# Patient Record
Sex: Male | Born: 1985 | Race: White | Hispanic: No | Marital: Married | Smoking: Never smoker
Health system: Southern US, Community
[De-identification: ages and names within clinical notes are randomized; demographics above are authoritative.]

## PROBLEM LIST (undated history)

## (undated) DIAGNOSIS — E785 Hyperlipidemia, unspecified: Secondary | ICD-10-CM

## (undated) DIAGNOSIS — I10 Essential (primary) hypertension: Secondary | ICD-10-CM

## (undated) DIAGNOSIS — I493 Ventricular premature depolarization: Secondary | ICD-10-CM

## (undated) DIAGNOSIS — F411 Generalized anxiety disorder: Secondary | ICD-10-CM

## (undated) DIAGNOSIS — R030 Elevated blood-pressure reading, without diagnosis of hypertension: Secondary | ICD-10-CM

## (undated) DIAGNOSIS — M109 Gout, unspecified: Secondary | ICD-10-CM

## (undated) HISTORY — DX: Gout, unspecified: M10.9

## (undated) HISTORY — DX: Generalized anxiety disorder: F41.1

## (undated) HISTORY — DX: Essential (primary) hypertension: I10

## (undated) HISTORY — DX: Hyperlipidemia, unspecified: E78.5

## (undated) HISTORY — DX: Ventricular premature depolarization: I49.3

## (undated) HISTORY — DX: Elevated blood-pressure reading, without diagnosis of hypertension: R03.0

---
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
## 2015-12-21 ENCOUNTER — Ambulatory Visit (INDEPENDENT_AMBULATORY_CARE_PROVIDER_SITE_OTHER): Payer: 59 | Admitting: Family Medicine

## 2015-12-21 ENCOUNTER — Encounter: Payer: Self-pay | Admitting: Family Medicine

## 2015-12-21 VITALS — BP 142/84 | HR 99 | Temp 98.7°F | Ht 70.0 in | Wt 209.1 lb

## 2015-12-21 DIAGNOSIS — H6991 Unspecified Eustachian tube disorder, right ear: Secondary | ICD-10-CM | POA: Diagnosis not present

## 2015-12-21 DIAGNOSIS — J069 Acute upper respiratory infection, unspecified: Secondary | ICD-10-CM | POA: Diagnosis not present

## 2015-12-21 DIAGNOSIS — R0981 Nasal congestion: Secondary | ICD-10-CM

## 2015-12-21 DIAGNOSIS — H9201 Otalgia, right ear: Secondary | ICD-10-CM

## 2015-12-21 MED ORDER — AMOXICILLIN 875 MG PO TABS: 875.0000 mg | ORAL_TABLET | Freq: Two times a day (BID) | ORAL | 0 refills | Status: DC

## 2015-12-21 NOTE — Progress Notes (Signed)
Subjective:    Patient ID: Nathaniel Frank, male    DOB: 15-Aug-1985, 30 y.o.   MRN: [id number]  HPI This is a 30 yo who presents today with right ear fullness, nasal congestion for 2 weeks. Felt really bad for about 2 days (was in bed), but feels a lot better. Most annoying symptom is ear fullness. Started with feeling like he was getting a cold, had scratchy throat that progressed to nasal drainage (green and bloody), cough that has since resolved. Not currently taking any medicine. Took a couple of doses of dayquil/nyquil at beginning of illness. Had sharp pain in ear but now it is full feeling and sound is muffled. Never smoked, no history of asthma or seasonal allergies. No fever.   He has recently moved back to the area from [place], [place]. He is a Merchandiser, retail at [employer]. His wife will be induced in 4 days (son). He also lives with his step-children (ages 40,9). He has an appointment to establish care with Mayra Reel, NP 01/04/16.   He had PVCs several years ago and had cardiology work up. Was told they would resolve and they did. Was on Bystolic for intermittent HTN which improved and he was able to come off. No current palpitations. No problems taking OTC cold/allergy preparations.   No past medical history on file. No past surgical history on file. No family history on file. Social History  Substance Use Topics  . Smoking status: Never Smoker  . Smokeless tobacco: Never Used  . Alcohol use Yes     Comment: soical        Review of Systems Per HPI    Objective:   Physical Exam  Constitutional: He is oriented to person, place, and time. He appears well-developed and well-nourished. No distress.  HENT:  Head: Atraumatic.  Right Ear: External ear and ear canal normal. Tympanic membrane is retracted.  Left Ear: Tympanic membrane, external ear and ear canal normal.  Nose: Rhinorrhea present. No mucosal edema. Right sinus exhibits no maxillary sinus tenderness and no frontal sinus  tenderness. Left sinus exhibits no maxillary sinus tenderness and no frontal sinus tenderness.  Eyes: Conjunctivae are normal.  Neck: Normal range of motion. Neck supple.  Cardiovascular: Normal rate, regular rhythm and normal heart sounds.   Pulmonary/Chest: Effort normal and breath sounds normal.  Neurological: He is alert and oriented to person, place, and time.  Skin: Skin is warm and dry. He is not diaphoretic.  Psychiatric: He has a normal mood and affect. His behavior is normal. Judgment and thought content normal.  Vitals reviewed.     BP (!) 142/84   Pulse 99   Temp 98.7 F (37.1 C) (Oral)   Ht 5\' 10"  (1.778 m)   Wt 209 lb 1.9 oz (94.9 kg)   SpO2 98%   BMI 30.01 kg/m      Assessment & Plan:  1. Otalgia, right - OTC analgesics PRN  2. Eustachian tube disorder, right - Provided written and verbal information regarding diagnosis and treatment. - OTC analgesics, decongestants, Afrin nasal spray for 3 days max  3. Sinus congestion - see #2  4. Acute upper respiratory infection - provided wait and see antibiotic and instructed patient to take if not better in 48 hours with symptomatic relief - amoxicillin (AMOXIL) 875 MG tablet; Take 1 tablet (875 mg total) by mouth 2 (two) times daily.  Dispense: 14 tablet; Refill: 0 - RTC precautions reviewed  Olean Ree, FNP-BC  [facility]  [place], [facility]  12/21/2015 1:39 PM

## 2015-12-21 NOTE — Progress Notes (Signed)
Pre visit review using our clinic review tool, if applicable. No additional management support is needed unless otherwise documented below in the visit note. 

## 2015-12-21 NOTE — Patient Instructions (Signed)
  For muscle aches, headache, sore throat you can take over the counter acetaminophen or ibuprofen as directed on the package For nasal congestion you can use Afrin nasal spray twice a day for up to 3 days, and /or sudafed, and/or saline nasal spray For cough you can use Delsym cough syrup Please come back to see us or go to the emergency department if you are not better in 5 to 7 days or if you develop fever over 101 for more than 48 hours or if you develop wheezing or shortness of breath.  If you are not better in 48 hours, please have antibiotic prescription filled and start taking.

## 2016-01-04 ENCOUNTER — Encounter: Payer: Self-pay | Admitting: Primary Care

## 2016-01-04 ENCOUNTER — Ambulatory Visit (INDEPENDENT_AMBULATORY_CARE_PROVIDER_SITE_OTHER): Payer: 59 | Admitting: Primary Care

## 2016-01-04 DIAGNOSIS — J302 Other seasonal allergic rhinitis: Secondary | ICD-10-CM

## 2016-01-04 DIAGNOSIS — R030 Elevated blood-pressure reading, without diagnosis of hypertension: Secondary | ICD-10-CM

## 2016-01-04 DIAGNOSIS — I10 Essential (primary) hypertension: Secondary | ICD-10-CM | POA: Insufficient documentation

## 2016-01-04 DIAGNOSIS — IMO0001 Reserved for inherently not codable concepts without codable children: Secondary | ICD-10-CM

## 2016-01-04 DIAGNOSIS — J309 Allergic rhinitis, unspecified: Secondary | ICD-10-CM | POA: Insufficient documentation

## 2016-01-04 NOTE — Assessment & Plan Note (Signed)
Long history of elevated blood pressure readings in the 140s/80's. Prior treatment of PVCs with Bystolic but has not taken since 2012. Will have him work on DelphiDASH diet as this has proved to be effective in the past. Discussion today regarding long-term effects of hypertension. We'll continue to monitor and if no improvement then we will need to initiate oral treatment.

## 2016-01-04 NOTE — Patient Instructions (Signed)
Try Claritin-D/Allegra-D/ZyrtecD for ear congestion. This may be purchased behind the pharmacy counter. Take this as directed for 5 days, then switch to plain Claritin/Allegra/Zyrtec.   Try using Flonase (fluticasone) nasal spray everyday for the next 2-3 weeks. Instill 1 spray in each nostril twice daily.   Your blood pressure is slightly too high. Work to reduce consumption of salt in your diet. Take a look at the information below.  Please schedule a physical with me within the next 6 months. You may also schedule a lab only appointment 3-4 days prior. We will discuss your lab results in detail during your physical.  It was a pleasure to meet you today! Please don't hesitate to call me with any questions. Welcome to [facility]!  DASH Eating Plan DASH stands for "Dietary Approaches to Stop Hypertension." The DASH eating plan is a healthy eating plan that has been shown to reduce high blood pressure (hypertension). Additional health benefits may include reducing the risk of type 2 diabetes mellitus, heart disease, and stroke. The DASH eating plan may also help with weight loss. WHAT DO I NEED TO KNOW ABOUT THE DASH EATING PLAN? For the DASH eating plan, you will follow these general guidelines:  Choose foods with a percent daily value for sodium of less than 5% (as listed on the food label).  Use salt-free seasonings or herbs instead of table salt or sea salt.  Check with your health care provider or pharmacist before using salt substitutes.  Eat lower-sodium products, often labeled as "lower sodium" or "no salt added."  Eat fresh foods.  Eat more vegetables, fruits, and low-fat dairy products.  Choose whole grains. Look for the word "whole" as the first word in the ingredient list.  Choose fish and skinless chicken or Malaw[place]urkey more often than red meat. Limit fish, poultry, and meat to 6 oz (170 g) each day.  Limit sweets, desserts, sugars, and sugary drinks.  Choose heart-healthy  fats.  Limit cheese to 1 oz (28 g) per day.  Eat more home-cooked food and less restaurant, buffet, and fast food.  Limit fried foods.  Cook foods using methods other than frying.  Limit canned vegetables. If you do use them, rinse them well to decrease the sodium.  When eating at a restaurant, ask that your food be prepared with less salt, or no salt if possible. WHAT FOODS CAN I EAT? Seek help from a dietitian for individual calorie needs. Grains Whole grain or whole wheat bread. Brown rice. Whole grain or whole wheat pasta. Quinoa, bulgur, and whole grain cereals. Low-sodium cereals. Corn or whole wheat flour tortillas. Whole grain cornbread. Whole grain crackers. Low-sodium crackers. Vegetables Fresh or frozen vegetables (raw, steamed, roasted, or grilled). Low-sodium or reduced-sodium tomato and vegetable juices. Low-sodium or reduced-sodium tomato sauce and paste. Low-sodium or reduced-sodium canned vegetables.  Fruits All fresh, canned (in natural juice), or frozen fruits. Meat and Other Protein Products Ground beef (85% or leaner), grass-fed beef, or beef trimmed of fat. Skinless chicken or Malaw[place]urkey. Ground chicken or Mal[place]urkey. Pork trimmed of fat. All fish and seafood. Eggs. Dried beans, peas, or lentils. Unsalted nuts and seeds. Unsalted canned beans. Dairy Low-fat dairy products, such as skim or 1% milk, 2% or reduced-fat cheeses, low-fat ricotta or cottage cheese, or plain low-fat yogurt. Low-sodium or reduced-sodium cheeses. Fats and Oils Tub margarines without trans fats. Light or reduced-fat mayonnaise and salad dressings (reduced sodium). Avocado. Safflower, olive, or canola oils. Natural peanut or almond butter. Other Unsalted popcorn and  pretzels. The items listed above may not be a complete list of recommended foods or beverages. Contact your dietitian for more options. WHAT FOODS ARE NOT RECOMMENDED? Grains White bread. White pasta. White rice. Refined cornbread.  Bagels and croissants. Crackers that contain trans fat. Vegetables Creamed or fried vegetables. Vegetables in a cheese sauce. Regular canned vegetables. Regular canned tomato sauce and paste. Regular tomato and vegetable juices. Fruits Dried fruits. Canned fruit in light or heavy syrup. Fruit juice. Meat and Other Protein Products Fatty cuts of meat. Ribs, chicken wings, bacon, sausage, bologna, salami, chitterlings, fatback, hot dogs, bratwurst, and packaged luncheon meats. Salted nuts and seeds. Canned beans with salt. Dairy Whole or 2% milk, cream, half-and-half, and cream cheese. Whole-fat or sweetened yogurt. Full-fat cheeses or blue cheese. Nondairy creamers and whipped toppings. Processed cheese, cheese spreads, or cheese curds. Condiments Onion and garlic salt, seasoned salt, table salt, and sea salt. Canned and packaged gravies. Worcestershire sauce. Tartar sauce. Barbecue sauce. Teriyaki sauce. Soy sauce, including reduced sodium. Steak sauce. Fish sauce. Oyster sauce. Cocktail sauce. Horseradish. Ketchup and mustard. Meat flavorings and tenderizers. Bouillon cubes. Hot sauce. Tabasco sauce. Marinades. Taco seasonings. Relishes. Fats and Oils Butter, stick margarine, lard, shortening, ghee, and bacon fat. Coconut, palm kernel, or palm oils. Regular salad dressings. Other Pickles and olives. Salted popcorn and pretzels. The items listed above may not be a complete list of foods and beverages to avoid. Contact your dietitian for more information. WHERE CAN I FIND MORE INFORMATION? National Heart, Lung, and Blood Institute: [employer]   This information is not intended to replace advice given to you by your health care provider. Make sure you discuss any questions you have with your health care provider.   Document Released: 03/27/2011 Document Revised: 04/28/2014 Document Reviewed: 02/09/2013 Elsevier Interactive Patient Education [employer].

## 2016-01-04 NOTE — Progress Notes (Signed)
Pre visit review using our clinic review tool, if applicable. No additional management support is needed unless otherwise documented below in the visit note. 

## 2016-01-04 NOTE — Assessment & Plan Note (Signed)
Treated on 09/01 for URI. Completed amoxicillin Monday this week. Overall feeling better, however, does still experience right ear pressure/pain. Exam today with bulging TM on right side without erythema or evidence of infection. Will have him try Claritin-D for 5 days then switch to plain Claritin. Start Flonase twice daily. He will notify us if no improvement.

## 2016-01-04 NOTE — Progress Notes (Signed)
Subjective:    Patient ID: Nathaniel Frank, male    DOB: 13-Mar-1986, 30 y.o.   MRN: [id number]  HPI  Mr. Persky is a 30 year old male who presents today to establish care and discuss the problems mentioned below. Will obtain old records. His last physical was several years ago.   1) URI/Allergic Rhinitis: Evaluated on 12/21/15 for complaints of nasal congestion and ear fullness for 2 weeks prior. He was diagnosed with eustachian tube disorder and URI, and was provided with a script for Amoxicillin 875 mg tablets to use if no improvement.   Since his last visit he's feeling better, but has continued to noticed right ear pressure. He completed his Amoxicillin Monday this week. He's used Flonase for 3 days and then started the antibiotic. He's also experiencing sore throat and post nasal drip. Denies cough, fevers, body aches.   2) Elevated Blood Pressure Reading: History of elevated readings in the past. His BP runs 140-145/60's-80's on average. He was once managed on Bystolic in the past for PVC's, but has not taken this medication since 2012. No family history of high blood pressure. His last BP check was on 09/01 at 142/84, and 1 week prior to that at his work health screening which was 140's/80's. Denies chest pain, dizziness, shortness of breath, headaches.  He has lowered his BP before with improvements in diet including salt reduction. He just moved to the area and has been eating junk food and fast food recently.  Review of Systems  Constitutional: Negative for chills and fever.  HENT: Positive for congestion, ear pain and postnasal drip. Negative for sinus pressure and sore throat.   Eyes: Negative for visual disturbance.  Respiratory: Negative for cough and shortness of breath.   Cardiovascular: Negative for chest pain.  Neurological: Negative for dizziness and headaches.       Past Medical History:  Diagnosis Date  . Hypertension   . PVC (premature ventricular contraction)        Social History   Social History  . Marital status: Legally Separated    Spouse name: N/A  . Number of children: N/A  . Years of education: N/A   Occupational History  . Not on file.   Social History Main Topics  . Smoking status: Never Smoker  . Smokeless tobacco: Never Used  . Alcohol use Yes     Comment: soical  . Drug use: Unknown  . Sexual activity: Not on file   Other Topics Concern  . Not on file   Social History Narrative   Engaged.   3 children.   Works for [employer].   Enjoys golfing.     Past Surgical History:  Procedure Laterality Date  . TONSILLECTOMY AND ADENOIDECTOMY  1999    Family History  Problem Relation Age of Onset  . Renal cancer Father     No Known Allergies  No current outpatient prescriptions on file prior to visit.   No current facility-administered medications on file prior to visit.     BP (!) 142/80   Pulse 85   Temp 98.5 F (36.9 C) (Oral)   Ht 5\' 10"  (1.778 m)   Wt 208 lb 1.9 oz (94.4 kg)   SpO2 98%   BMI 29.86 kg/m    Objective:   Physical Exam  Constitutional: He appears well-nourished.  HENT:  Right Ear: Ear canal normal. Tympanic membrane is bulging. Tympanic membrane is not erythematous.  Left Ear: Tympanic membrane and ear canal normal.  Tympanic membrane is not erythematous and not bulging.  Nose: No mucosal edema. Right sinus exhibits no maxillary sinus tenderness and no frontal sinus tenderness. Left sinus exhibits no maxillary sinus tenderness and no frontal sinus tenderness.  Mouth/Throat: Oropharynx is clear and moist.  Eyes: Conjunctivae are normal.  Neck: Neck supple.  Cardiovascular: Normal rate and regular rhythm.   Pulmonary/Chest: Effort normal and breath sounds normal. He has no wheezes. He has no rales.  Skin: Skin is warm and dry.          Assessment & Plan:

## 2016-07-30 ENCOUNTER — Ambulatory Visit (INDEPENDENT_AMBULATORY_CARE_PROVIDER_SITE_OTHER)
Admission: RE | Admit: 2016-07-30 | Discharge: 2016-07-30 | Disposition: A | Discharge status: Home / Self Care | Payer: 59 | Source: Ambulatory Visit | Attending: Family Medicine | Admitting: Family Medicine

## 2016-07-30 ENCOUNTER — Encounter: Payer: Self-pay | Admitting: Family Medicine

## 2016-07-30 ENCOUNTER — Ambulatory Visit (INDEPENDENT_AMBULATORY_CARE_PROVIDER_SITE_OTHER): Payer: 59 | Admitting: Family Medicine

## 2016-07-30 VITALS — BP 130/88 | HR 92 | Temp 98.6°F | Ht 70.0 in | Wt 222.8 lb

## 2016-07-30 DIAGNOSIS — M25572 Pain in left ankle and joints of left foot: Secondary | ICD-10-CM

## 2016-07-30 DIAGNOSIS — M79672 Pain in left foot: Secondary | ICD-10-CM

## 2016-07-30 MED ORDER — COLCHICINE 0.6 MG PO TABS: 0.6000 mg | ORAL_TABLET | Freq: Two times a day (BID) | ORAL | 2 refills | Status: DC

## 2016-07-30 MED ORDER — INDOMETHACIN 50 MG PO CAPS: 50.0000 mg | ORAL_CAPSULE | Freq: Three times a day (TID) | ORAL | 2 refills | Status: DC

## 2016-07-30 MED ORDER — COLCHICINE 0.6 MG PO CAPS: 1.0000 | ORAL_CAPSULE | Freq: Two times a day (BID) | ORAL | 2 refills | Status: DC

## 2016-07-30 NOTE — Progress Notes (Signed)
Pre visit review using our clinic review tool, if applicable. No additional management support is needed unless otherwise documented below in the visit note. 

## 2016-07-30 NOTE — Progress Notes (Signed)
Dr. Karleen Hampshire T. Damonta Cossey, MD, CAQ [facility] [address] [place], [address] Phone: [phone] Fax: [phone]  07/30/2016  Patient: Nathaniel Frank, MRN: [id number], DOB: 08/25/1985, 31 y.o.  Primary Physician:  Morrie Sheldon, NP   Chief Complaint  Patient presents with  . Ankle Pain    Left-started hurting on Tuesday but has progressively gotten worse   Subjective:   Nathaniel Frank is a 31 y.o. very pleasant male patient who presents with the following:  Went golfing on Sunday - felt like it needed to pop on Monday and has gotten progressively worse. First couple of steps have been more painful.  This is progressed since then, and he has not had any injury at all.  Last night it hurt so bad that he had a hard time even keeping it on his mattress.  This morning he had difficulty walking.  There is been no known warmth or redness.  Previously he did have a history of a painful MTP joint in the opposite foot.  Really bad excruciating pain.   Past Medical History, Surgical History, Social History, Family History, Problem List, Medications, and Allergies have been reviewed and updated if relevant.  Patient Active Problem List   Diagnosis Date Noted  . Allergic rhinitis 01/04/2016  . Elevated blood pressure 01/04/2016    Past Medical History:  Diagnosis Date  . Hypertension   . PVC (premature ventricular contraction)     Past Surgical History:  Procedure Laterality Date  . TONSILLECTOMY AND ADENOIDECTOMY  1999    Social History   Social History  . Marital status: Legally Separated    Spouse name: N/A  . Number of children: N/A  . Years of education: N/A   Occupational History  . Not on file.   Social History Main Topics  . Smoking status: Never Smoker  . Smokeless tobacco: Never Used  . Alcohol use Yes     Comment: soical  . Drug use: Unknown  . Sexual activity: Not on file   Other Topics Concern  . Not  on file   Social History Narrative   Engaged.   3 children.   Works for [employer].   Enjoys golfing.     Family History  Problem Relation Age of Onset  . Renal cancer Father     No Known Allergies  Medication list reviewed and updated in full in [facility].  GEN: No fevers, chills. Nontoxic. Primarily MSK c/o today. MSK: Detailed in the HPI GI: tolerating PO intake without difficulty Neuro: No numbness, parasthesias, or tingling associated. Otherwise the pertinent positives of the ROS are noted above.   Objective:   BP 130/88   Pulse 92   Temp 98.6 F (37 C) (Oral)   Ht  (1.778 m)   Wt 222 lb 12 oz (101 kg)   BMI 31.96 kg/m    GEN: WDWN, NAD, Non-toxic, Alert & Oriented x 3 HEENT: Atraumatic, Normocephalic.  Ears and Nose: No external deformity. EXTR: No clubbing/cyanosis/edema NEURO: difficulty bearing weight on L foot PSYCH: Normally interactive. Conversant. Not depressed or anxious appearing.  Calm demeanor.    Left foot and ankle.  Mild tenderness with palpation and movement of the left MTP, great.  Forefoot otherwise is nontender.  Midfoot is mildly tender to compression.  The talus in the true ankle joint are mildly tender to palpation.  The lateral ankle and medial ankle in the region of the peroneal and  posterior tibialis tendons are tender to palpation.  There is no significant swelling or redness.  Radiology: Dg Ankle Complete Left  Result Date: 07/30/2016 CLINICAL DATA:  Left ankle pain, no known injury, initial encounter EXAM: LEFT ANKLE COMPLETE - 3+ VIEW COMPARISON:  None. FINDINGS: There is no evidence of fracture, dislocation, or joint effusion. There is no evidence of arthropathy or other focal bone abnormality. Soft tissues are unremarkable. IMPRESSION: No acute abnormality noted. Electronically Signed   By: Alcide Clever M.D.   On: 07/30/2016 12:34   Dg Foot Complete Left  Result Date: 07/30/2016 CLINICAL DATA:  Left foot pain, no known  injury, initial encounter EXAM: LEFT FOOT - COMPLETE 3+ VIEW COMPARISON:  None. FINDINGS: There is no evidence of fracture or dislocation. There is no evidence of arthropathy or other focal bone abnormality. Soft tissues are unremarkable. IMPRESSION: No acute abnormality noted. Electronically Signed   By: Alcide Clever M.D.   On: 07/30/2016 12:35     Assessment and Plan:   Acute foot pain, left - Plan: DG Foot Complete Left  Acute left ankle pain - Plan: DG Ankle Complete Left  Probable gout versus CPPD.  No injury mechanism.  Ankle appears stable.  All ligaments are normal.  He does have some tenderness additionally in the peroneal and posterior tibialis tendons.  Follow-up: No Follow-up on file.  Meds ordered this encounter  Medications  . triamcinolone (NASACORT ALLERGY 24HR) 55 MCG/ACT AERO nasal inhaler    Sig: Place 2 sprays into the nose daily.  . indomethacin (INDOCIN) 50 MG capsule    Sig: Take 1 capsule (50 mg total) by mouth 3 (three) times daily with meals.    Dispense:  60 capsule    Refill:  2  . DISCONTD: Colchicine (MITIGARE) 0.6 MG CAPS    Sig: Take 1 capsule by mouth 2 (two) times daily.    Dispense:  60 capsule    Refill:  2  . colchicine 0.6 MG tablet    Sig: Take 1 tablet (0.6 mg total) by mouth 2 (two) times daily.    Dispense:  60 tablet    Refill:  2   Medications Discontinued During This Encounter  Medication Reason  . Colchicine (MITIGARE) 0.6 MG CAPS Not covered by the pt's insurance   Orders Placed This Encounter  Procedures  . DG Ankle Complete Left  . DG Foot Complete Left    Signed,  Renn Dirocco T. Staria Birkhead, MD   Allergies as of 07/30/2016   No Known Allergies     Medication List       Accurate as of 07/30/16  6:25 PM. Always use your most recent med list.          colchicine 0.6 MG tablet Take 1 tablet (0.6 mg total) by mouth 2 (two) times daily.   indomethacin 50 MG capsule Commonly known as:  INDOCIN Take 1 capsule (50 mg total)  by mouth 3 (three) times daily with meals.   NASACORT ALLERGY 24HR 55 MCG/ACT Aero nasal inhaler Generic drug:  triamcinolone Place 2 sprays into the nose daily.

## 2016-09-03 ENCOUNTER — Other Ambulatory Visit: Payer: Self-pay | Admitting: Primary Care

## 2016-09-03 DIAGNOSIS — Z0000 Encounter for general adult medical examination without abnormal findings: Secondary | ICD-10-CM

## 2016-09-10 ENCOUNTER — Other Ambulatory Visit: Payer: 59

## 2016-09-17 ENCOUNTER — Encounter: Payer: 59 | Admitting: Primary Care

## 2016-10-08 ENCOUNTER — Other Ambulatory Visit (INDEPENDENT_AMBULATORY_CARE_PROVIDER_SITE_OTHER): Payer: 59

## 2016-10-08 DIAGNOSIS — Z0000 Encounter for general adult medical examination without abnormal findings: Secondary | ICD-10-CM | POA: Diagnosis not present

## 2016-10-09 LAB — COMPREHENSIVE METABOLIC PANEL
A/G RATIO: 2 (ref 1.2–2.2)
ALT: 26 IU/L (ref 0–44)
AST: 20 IU/L (ref 0–40)
Albumin: 4.9 g/dL (ref 3.5–5.5)
Alkaline Phosphatase: 43 IU/L (ref 39–117)
BILIRUBIN TOTAL: 0.8 mg/dL (ref 0.0–1.2)
BUN/Creatinine Ratio: 12 (ref 9–20)
BUN: 12 mg/dL (ref 6–20)
CALCIUM: 9.6 mg/dL (ref 8.7–10.2)
CHLORIDE: 103 mmol/L (ref 96–106)
CO2: 21 mmol/L (ref 20–29)
Creatinine, Ser: 1.02 mg/dL (ref 0.76–1.27)
GFR, EST AFRICAN AMERICAN: 111 mL/min/{1.73_m2} (ref >59)
GFR, EST NON AFRICAN AMERICAN: 96 mL/min/{1.73_m2} (ref >59)
GLOBULIN, TOTAL: 2.5 g/dL (ref 1.5–4.5)
Glucose: 108 mg/dL — ABNORMAL HIGH (ref 65–99)
Potassium: 4.2 mmol/L (ref 3.5–5.2)
Sodium: 140 mmol/L (ref 134–144)
TOTAL PROTEIN: 7.4 g/dL (ref 6.0–8.5)

## 2016-10-09 LAB — LIPID PANEL
Chol/HDL Ratio: 5.7 ratio — ABNORMAL HIGH (ref 0.0–5.0)
Cholesterol, Total: 235 mg/dL — ABNORMAL HIGH (ref 100–199)
HDL: 41 mg/dL (ref >39)
LDL Calculated: 145 mg/dL — ABNORMAL HIGH (ref 0–99)
Triglycerides: 244 mg/dL — ABNORMAL HIGH (ref 0–149)
VLDL Cholesterol Cal: 49 mg/dL — ABNORMAL HIGH (ref 5–40)

## 2016-10-14 ENCOUNTER — Ambulatory Visit (INDEPENDENT_AMBULATORY_CARE_PROVIDER_SITE_OTHER): Payer: 59 | Admitting: Primary Care

## 2016-10-14 ENCOUNTER — Encounter: Payer: Self-pay | Admitting: Primary Care

## 2016-10-14 VITALS — BP 134/84 | HR 75 | Temp 98.0°F | Ht 70.0 in | Wt 224.4 lb

## 2016-10-14 DIAGNOSIS — F411 Generalized anxiety disorder: Secondary | ICD-10-CM | POA: Diagnosis not present

## 2016-10-14 DIAGNOSIS — Z0000 Encounter for general adult medical examination without abnormal findings: Secondary | ICD-10-CM | POA: Insufficient documentation

## 2016-10-14 DIAGNOSIS — Z0001 Encounter for general adult medical examination with abnormal findings: Secondary | ICD-10-CM | POA: Diagnosis not present

## 2016-10-14 DIAGNOSIS — R030 Elevated blood-pressure reading, without diagnosis of hypertension: Secondary | ICD-10-CM

## 2016-10-14 MED ORDER — ESCITALOPRAM OXALATE 10 MG PO TABS: 10.0000 mg | ORAL_TABLET | Freq: Every day | ORAL | 1 refills | Status: DC

## 2016-10-14 NOTE — Patient Instructions (Signed)
Start Lexapro tablets for anxiety. Start by taking 1/2 tablet daily for 8 days, then increase to 1 full tablet thereafter.  Start exercising. You should be getting 150 minutes of moderate intensity exercise weekly.  It's important to improve your diet by reducing consumption of fried food, pizza, processed snack foods, sugary drinks. Increase consumption of fresh vegetables and fruits, whole grains, water.  Ensure you are drinking 64 ounces of water daily.  Return to a [employer] [employer] station fasting for repeat cholesterol and blood sugar testing. Make sure you are fasting 8 hours prior to this test.  Follow up in 6 weeks for re-evaluation of anxiety. Call me sooner if you have any problems.  It was a pleasure to see you today!

## 2016-10-14 NOTE — Assessment & Plan Note (Signed)
Overall improved, encouraged weight loss through healthy diet and exercise. Avoid fast/fried/processed foods. Continue to monitor.

## 2016-10-14 NOTE — Progress Notes (Signed)
Subjective:    Patient ID: Nathaniel Frank, male    DOB: 07/24/1985, 31 y.o.   MRN: [id number]  HPI  Nathaniel Frank is a 31 year old male who presents today for complete physical and a chief complaint of anxiety.  He has been struggling with anxiety intermittently for years. He's experiencing both home and occupational stress as he's moved to a new state, is in a new marriage with two new step-children, a newborn at home, and increased demands at work. He's feeling overwhelmed with work and home life. His symptoms have been nearly everyday. Previously managed on lorazepam that he took at bedtime for anxiety/insomnia. He was also prescribed Zoloft, took it only for a few days as it caused a sluggish feeling. He would like to have medication like lorazepam to take as needed for breakthrough anxiety. He doesn't wish to take medication everyday despite his symptoms occuring nearly daily. GAD 7 score of 16 today.  Immunizations: -Tetanus: Completed over 10 years ago.  -Influenza: Did not complete last season   Diet: He endorses a fair diet. Breakfast: Coffee Lunch: Chips, vending machine food, taco truck Dinner: Vegetables, frozen pizza, hot dogs, steak Snacks: None Desserts: None Beverages: Coffee, water, 1-2 glasses of wine/beer  Exercise: He does not currently exercise Eye exam: Completed years ago, wears readers. Dental exam: Completes annually typically.   Review of Systems  Constitutional: Negative for unexpected weight change.  HENT: Negative for rhinorrhea.   Respiratory: Negative for cough and shortness of breath.   Cardiovascular: Negative for chest pain.  Gastrointestinal: Negative for constipation and diarrhea.  Genitourinary: Negative for difficulty urinating.  Musculoskeletal: Negative for arthralgias and myalgias.  Skin: Negative for rash.  Allergic/Immunologic: Negative for environmental allergies.  Neurological: Negative for dizziness, numbness and headaches.    Psychiatric/Behavioral:       See HPI       Past Medical History:  Diagnosis Date  . Hypertension   . PVC (premature ventricular contraction)      Social History   Social History  . Marital status: Legally Separated    Spouse name: N/A  . Number of children: N/A  . Years of education: N/A   Occupational History  . Not on file.   Social History Main Topics  . Smoking status: Never Smoker  . Smokeless tobacco: Never Used  . Alcohol use Yes     Comment: soical  . Drug use: Unknown  . Sexual activity: Not on file   Other Topics Concern  . Not on file   Social History Narrative   Engaged.   3 children.   Works for [employer]   Enjoys golfing.     Past Surgical History:  Procedure Laterality Date  . TONSILLECTOMY AND ADENOIDECTOMY  1999    Family History  Problem Relation Age of Onset  . Renal cancer Father     No Known Allergies  Current Outpatient Prescriptions on File Prior to Visit  Medication Sig Dispense Refill  . triamcinolone (NASACORT ALLERGY 24HR) 55 MCG/ACT AERO nasal inhaler Place 2 sprays into the nose daily.    . colchicine 0.6 MG tablet Take 1 tablet (0.6 mg total) by mouth 2 (two) times daily. (Patient not taking: Reported on 10/14/2016) 60 tablet 2  . indomethacin (INDOCIN) 50 MG capsule Take 1 capsule (50 mg total) by mouth 3 (three) times daily with meals. (Patient not taking: Reported on 10/14/2016) 60 capsule 2   No current facility-administered medications on file prior to visit.  BP 134/84   Pulse 75   Temp 98 F (36.7 C) (Oral)   Ht 5\' 10"  (1.778 m)   Wt 224 lb 6.4 oz (101.8 kg)   SpO2 98%   BMI 32.20 kg/m    Objective:   Physical Exam  Constitutional: He is oriented to person, place, and time. He appears well-nourished.  HENT:  Right Ear: Tympanic membrane and ear canal normal.  Left Ear: Tympanic membrane and ear canal normal.  Nose: Nose normal. Right sinus exhibits no maxillary sinus tenderness and no frontal sinus  tenderness. Left sinus exhibits no maxillary sinus tenderness and no frontal sinus tenderness.  Mouth/Throat: Oropharynx is clear and moist.  Eyes: Conjunctivae and EOM are normal. Pupils are equal, round, and reactive to light.  Neck: Neck supple. Carotid bruit is not present. No thyromegaly present.  Cardiovascular: Normal rate, regular rhythm and normal heart sounds.   Pulmonary/Chest: Effort normal and breath sounds normal. He has no wheezes. He has no rales.  Abdominal: Soft. Bowel sounds are normal. There is no tenderness.  Musculoskeletal: Normal range of motion.  Neurological: He is alert and oriented to person, place, and time. He has normal reflexes. No cranial nerve deficit.  Skin: Skin is warm and dry.  Psychiatric: He has a normal mood and affect.          Assessment & Plan:

## 2016-10-14 NOTE — Assessment & Plan Note (Signed)
Ongoing for years, overall able to manage except for the last several months. GAD 7 score of 16 today.  Discussed that benzodiazepines are not appropriate in treating anxiety with daily symptoms. Recommended he try another SSRI such as Lexapro, he agrees. Patient is to take 1/2 tablet daily for 8 days, then advance to 1 full tablet thereafter. We discussed possible side effects of headache, GI upset, drowsiness, and SI/HI. If thoughts of SI/HI develop, we discussed to present to the emergency immediately. Patient verbalized understanding.   Will see him back in 6 weeks. If he can't tolerate Lexapro then consider Buspar. Will not be prescribing him lorazepam or other benzos.

## 2016-10-14 NOTE — Assessment & Plan Note (Signed)
Td due, will administer next visit. Discussed the importance of a healthy diet and regular exercise in order for weight loss, and to reduce the risk of other medical problems. Exam today overall unremarkable. Will treat anxiety. Labs are above goal, but he was not fasting. Will recheck lipids and glucose when fasting. Follow up in 1 year for annual exam.

## 2016-11-25 ENCOUNTER — Encounter: Payer: Self-pay | Admitting: Primary Care

## 2016-11-25 ENCOUNTER — Ambulatory Visit (INDEPENDENT_AMBULATORY_CARE_PROVIDER_SITE_OTHER): Payer: 59 | Admitting: Primary Care

## 2016-11-25 VITALS — BP 124/82 | HR 64 | Temp 98.8°F | Ht 70.0 in | Wt 224.4 lb

## 2016-11-25 DIAGNOSIS — F411 Generalized anxiety disorder: Secondary | ICD-10-CM | POA: Diagnosis not present

## 2016-11-25 MED ORDER — VENLAFAXINE HCL ER 37.5 MG PO CP24: 37.5000 mg | ORAL_CAPSULE | Freq: Every day | ORAL | 1 refills | Status: DC

## 2016-11-25 NOTE — Assessment & Plan Note (Signed)
No improvement on Lexapro, also with side effect of decreased libido. Will wean off Lexapro and switch to Effexor 37.5 mg. He will message/call if he encounters any problems with the weaning process or when initiating Effexor. Will contact him in 4 weeks for an update.

## 2016-11-25 NOTE — Patient Instructions (Signed)
Wean off of Lexapro by taking 1/2 tablet daily for 5 days, then stop.  Start venlafaxine (Effexor) XR 37.5 mg tablets for anxiety. Take 1 tablet by mouth every morning.  Please message me if you have any problems with the Effexor. I'll be in touch in 4 weeks for an update.  It was a pleasure to see you today!

## 2016-11-25 NOTE — Progress Notes (Signed)
   Subjective:    Patient ID: Nathaniel Frank, male    DOB: 06/20/1985, 31 y.o.   MRN: [id number]  HPI  Nathaniel Frank is a 31 year old male who presents today for follow up of anxiety. Evaluated in late June 2018 for complaints of feeling overwhelmed, stressed, anxious, worried. Symptoms were daily. We discussed options for treatment and initiated Lexapro.   Since his last visit he's not noticed any improvement in anxiety or other symptoms. He's also noticed decreased libido. He's compliant to his Lexapro daily. He did experience mild nausea for the first week, that did dissipate. He denies SI/HI. He would like to try a different medication as he doesn't believe the Lexapro is working.   Review of Systems  Respiratory: Negative for shortness of breath.   Cardiovascular: Negative for chest pain.  Gastrointestinal: Negative for nausea.  Genitourinary:       Decreased libido  Neurological: Negative for headaches.  Psychiatric/Behavioral: Negative for sleep disturbance and suicidal ideas.       See HPI       Past Medical History:  Diagnosis Date  . Elevated blood pressure reading   . GAD (generalized anxiety disorder)   . PVC (premature ventricular contraction)      Social History   Social History  . Marital status: Legally Separated    Spouse name: N/A  . Number of children: N/A  . Years of education: N/A   Occupational History  . Not on file.   Social History Main Topics  . Smoking status: Never Smoker  . Smokeless tobacco: Never Used  . Alcohol use Yes     Comment: soical  . Drug use: Unknown  . Sexual activity: Not on file   Other Topics Concern  . Not on file   Social History Narrative   Engaged.   3 children.   Works for [employer]   Enjoys golfing.     Past Surgical History:  Procedure Laterality Date  . TONSILLECTOMY AND ADENOIDECTOMY  1999    Family History  Problem Relation Age of Onset  . Renal cancer Father     No Known Allergies  Current  Outpatient Prescriptions on File Prior to Visit  Medication Sig Dispense Refill  . triamcinolone (NASACORT ALLERGY 24HR) 55 MCG/ACT AERO nasal inhaler Place 2 sprays into the nose daily.    . colchicine 0.6 MG tablet Take 1 tablet (0.6 mg total) by mouth 2 (two) times daily. (Patient not taking: Reported on 11/25/2016) 60 tablet 2  . indomethacin (INDOCIN) 50 MG capsule Take 1 capsule (50 mg total) by mouth 3 (three) times daily with meals. (Patient not taking: Reported on 11/25/2016) 60 capsule 2   No current facility-administered medications on file prior to visit.     BP 124/82   Pulse 64   Temp 98.8 F (37.1 C) (Oral)   Ht 5\' 10"  (1.778 m)   Wt 224 lb 6.4 oz (101.8 kg)   SpO2 98%   BMI 32.20 kg/m    Objective:   Physical Exam  Constitutional: He appears well-nourished.  Neck: Neck supple.  Cardiovascular: Normal rate and regular rhythm.   Pulmonary/Chest: Effort normal and breath sounds normal.  Skin: Skin is warm and dry.  Psychiatric: He has a normal mood and affect.          Assessment & Plan:

## 2016-12-23 ENCOUNTER — Telehealth: Payer: Self-pay | Admitting: Primary Care

## 2016-12-23 NOTE — Telephone Encounter (Signed)
-----   Message from Doreene NestKatherine K Lekeya Rollings, NP sent at 11/25/2016 12:00 PM EDT ----- Regarding: Anxiety How's he doing since we weaned him off Lexapro and started Effexor?

## 2016-12-23 NOTE — Telephone Encounter (Signed)
Message left for patient to return my call.  

## 2016-12-26 NOTE — Telephone Encounter (Signed)
Message left for patient to return my call.  

## 2017-01-09 ENCOUNTER — Encounter: Payer: Self-pay | Admitting: Primary Care

## 2017-01-09 ENCOUNTER — Encounter (INDEPENDENT_AMBULATORY_CARE_PROVIDER_SITE_OTHER): Payer: Self-pay

## 2017-01-09 ENCOUNTER — Ambulatory Visit (INDEPENDENT_AMBULATORY_CARE_PROVIDER_SITE_OTHER): Payer: 59 | Admitting: Primary Care

## 2017-01-09 VITALS — BP 124/84 | HR 85 | Temp 98.3°F | Ht 70.0 in | Wt 219.4 lb

## 2017-01-09 DIAGNOSIS — M109 Gout, unspecified: Secondary | ICD-10-CM

## 2017-01-09 DIAGNOSIS — F411 Generalized anxiety disorder: Secondary | ICD-10-CM

## 2017-01-09 DIAGNOSIS — M1A9XX0 Chronic gout, unspecified, without tophus (tophi): Secondary | ICD-10-CM | POA: Insufficient documentation

## 2017-01-09 MED ORDER — VENLAFAXINE HCL ER 75 MG PO CP24: 75.0000 mg | ORAL_CAPSULE | Freq: Every day | ORAL | 0 refills | Status: DC

## 2017-01-09 MED ORDER — PREDNISONE 10 MG PO TABS: ORAL_TABLET | ORAL | 0 refills | Status: DC

## 2017-01-09 NOTE — Assessment & Plan Note (Signed)
Symptoms since 2011, flares every 1-2 years. Acute episode today that has not resolved with either indomethacin or colchicine 2 weeks. Perception for prednisone course sent to pharmacy for treatment. Check uric acid level today. Continue to monitor flares, consider allopurinol if more than 4 flares in one year.

## 2017-01-09 NOTE — Progress Notes (Signed)
Subjective:    Patient ID: Nathaniel Frank, male    DOB: 27-Oct-1985, 31 y.o.   MRN: [id number]  HPI  Nathaniel Frank is a 31 year old male who presents today to discuss gout.  Diagnosed with gout to his left ankle in April 2018, provided with indomethacin and colchicine. He doesn't take either medication regularly. Two weeks ago he started to notice pain to the right great toe that felt similar to his pain in the ankle. Four days ago his pain started to improve, but no resolve. He's taken a few days of the indomethacin without improvement. He's also taken Colchicine once daily for the first week, then increased to BID for the past one week. He has noticed gradual improvement, but no difference when increasing the dose of colchicine.   He does remember experiencing toe pain intermittently since 2011, no formal diagnosis of gout until just recently. Flares occur 1-2 times annually. His typical pain is located to the great toes that feels like "a strain".   2) GAD: Currently managed on Effexor 37.5 mg. Previously managed on Lexapro for which he found no improvement and experienced side effects of decreased libido. Since initiation of Effexor he's feeling more calm, feeling less on "edge". He thinks he may need a dose increase as he continues to experience anxiety at home, does feel overwhelmed, but is able to notice that he's getting overwhelmed. He thinks he is on the right track with Effexor. Denies SI/HI, decreased libido.  Review of Systems  Constitutional: Negative for fever.  Gastrointestinal: Negative for abdominal pain.  Musculoskeletal: Positive for arthralgias.  Skin: Positive for color change.  Neurological: Negative for headaches.  Psychiatric/Behavioral: Negative for sleep disturbance. The patient is nervous/anxious.        Past Medical History:  Diagnosis Date  . Elevated blood pressure reading   . GAD (generalized anxiety disorder)   . PVC (premature ventricular contraction)        Social History   Social History  . Marital status: Legally Separated    Spouse name: N/A  . Number of children: N/A  . Years of education: N/A   Occupational History  . Not on file.   Social History Main Topics  . Smoking status: Never Smoker  . Smokeless tobacco: Never Used  . Alcohol use Yes     Comment: soical  . Drug use: Unknown  . Sexual activity: Not on file   Other Topics Concern  . Not on file   Social History Narrative   Engaged.   3 children.   Works for [employer].   Enjoys golfing.     Past Surgical History:  Procedure Laterality Date  . TONSILLECTOMY AND ADENOIDECTOMY  1999    Family History  Problem Relation Age of Onset  . Renal cancer Father     No Known Allergies  Current Outpatient Prescriptions on File Prior to Visit  Medication Sig Dispense Refill  . triamcinolone (NASACORT ALLERGY 24HR) 55 MCG/ACT AERO nasal inhaler Place 2 sprays into the nose daily.     No current facility-administered medications on file prior to visit.     BP 124/84   Pulse 85   Temp 98.3 F (36.8 C) (Oral)   Ht  (1.778 m)   Wt 219 lb 6.4 oz (99.5 kg)   SpO2 96%   BMI 31.48 kg/m    Objective:   Physical Exam  Constitutional: He appears well-nourished.  Neck: Neck supple.  Cardiovascular: Normal rate.  Pulmonary/Chest: Effort normal.  Musculoskeletal:  Swelling with erythema to proximal interphalangeal joint of the right great toe. Decrease in range of motion, tenderness.  Skin: Skin is warm and dry. There is erythema.          Assessment & Plan:

## 2017-01-09 NOTE — Patient Instructions (Signed)
Start prednisone tablets. Take four tablets for 2 days, then three tablets for 2 days, then two tablet for 2 days, then one tablet for 2 days.  We've increased the dose of your Effexor to 75 mg. You may take two of the 37.5 mg capsules until your current bottle is empty. Please update me in 1 month.  Complete lab work prior to leaving today. I will notify you of your results once received.   Complete lab work prior to leaving today. I will notify you of your results once received.   Low-Purine Diet Purines are compounds that affect the level of uric acid in your body. A low-purine diet is a diet that is low in purines. Eating a low-purine diet can prevent the level of uric acid in your body from getting too high and causing gout or kidney stones or both. What do I need to know about this diet?  Choose low-purine foods. Examples of low-purine foods are listed in the next section.  Drink plenty of fluids, especially water. Fluids can help remove uric acid from your body. Try to drink 8-16 cups (1.9-3.8 L) a day.  Limit foods high in fat, especially saturated fat, as fat makes it harder for the body to get rid of uric acid. Foods high in saturated fat include pizza, cheese, ice cream, whole milk, fried foods, and gravies. Choose foods that are lower in fat and lean sources of protein. Use olive oil when cooking as it contains healthy fats that are not high in saturated fat.  Limit alcohol. Alcohol interferes with the elimination of uric acid from your body. If you are having a gout attack, avoid all alcohol.  Keep in mind that different people's bodies react differently to different foods. You will probably learn over time which foods do or do not affect you. If you discover that a food tends to cause your gout to flare up, avoid eating that food. You can more freely enjoy foods that do not cause problems. If you have any questions about a food item, talk to your dietitian or health care  provider. Which foods are low, moderate, and high in purines? The following is a list of foods that are low, moderate, and high in purines. You can eat any amount of the foods that are low in purines. You may be able to have small amounts of foods that are moderate in purines. Ask your health care provider how much of a food moderate in purines you can have. Avoid foods high in purines. Grains  Foods low in purines: Enriched white bread, pasta, rice, cake, cornbread, popcorn.  Foods moderate in purines: Whole-grain breads and cereals, wheat germ, bran, oatmeal. Uncooked oatmeal. Dry wheat bran or wheat germ.  Foods high in purines: Pancakes, [place] toast, biscuits, muffins. Vegetables  Foods low in purines: All vegetables, except those that are moderate in purines.  Foods moderate in purines: Asparagus, cauliflower, spinach, mushrooms, green peas. Fruits  All fruits are low in purines. Meats and other Protein Foods  Foods low in purines: Eggs, nuts, peanut butter.  Foods moderate in purines: 80-90% lean beef, lamb, veal, pork, poultry, fish, eggs, peanut butter, nuts. Crab, lobster, oysters, and shrimp. Cooked dried beans, peas, and lentils.  Foods high in purines: Anchovies, sardines, herring, mussels, tuna, codfish, scallops, trout, and haddock. Nathaniel Frank. Organ meats (such as liver or kidney). Tripe. Game meat. Goose. Sweetbreads. Dairy  All dairy foods are low in purines. Low-fat and fat-free dairy products are best  because they are low in saturated fat. Beverages  Drinks low in purines: Water, carbonated beverages, tea, coffee, cocoa.  Drinks moderate in purines: Soft drinks and other drinks sweetened with high-fructose corn syrup. Juices. To find whether a food or drink is sweetened with high-fructose corn syrup, look at the ingredients list.  Drinks high in purines: Alcoholic beverages (such as beer). Condiments  Foods low in purines: Salt, herbs, olives, pickles, relishes,  vinegar.  Foods moderate in purines: Butter, margarine, oils, mayonnaise. Fats and Oils  Foods low in purines: All types, except gravies and sauces made with meat.  Foods high in purines: Gravies and sauces made with meat. Other Foods  Foods low in purines: Sugars, sweets, gelatin. Cake. Soups made without meat.  Foods moderate in purines: Meat-based or fish-based soups, broths, or bouillons. Foods and drinks sweetened with high-fructose corn syrup.  Foods high in purines: High-fat desserts (such as ice cream, cookies, cakes, pies, doughnuts, and chocolate). Contact your dietitian for more information on foods that are not listed here. This information is not intended to replace advice given to you by your health care provider. Make sure you discuss any questions you have with your health care provider. Document Released: 08/02/2010 Document Revised: 09/13/2015 Document Reviewed: 03/14/2013 Elsevier Interactive Patient Education  2017 [employer].

## 2017-01-10 LAB — URIC ACID: Uric Acid: 9.5 mg/dL — ABNORMAL HIGH (ref 3.7–8.6)

## 2017-01-14 ENCOUNTER — Encounter: Payer: Self-pay | Admitting: Primary Care

## 2017-01-14 DIAGNOSIS — J309 Allergic rhinitis, unspecified: Secondary | ICD-10-CM

## 2017-01-15 MED ORDER — TRIAMCINOLONE ACETONIDE 55 MCG/ACT NA AERO: 2.0000 | INHALATION_SPRAY | Freq: Every day | NASAL | 3 refills | Status: DC

## 2017-01-16 MED ORDER — FLUTICASONE PROPIONATE 50 MCG/ACT NA SUSP: 2.0000 | Freq: Every day | NASAL | 0 refills | Status: DC

## 2017-01-19 ENCOUNTER — Other Ambulatory Visit: Payer: Self-pay | Admitting: Primary Care

## 2017-01-19 DIAGNOSIS — F411 Generalized anxiety disorder: Secondary | ICD-10-CM

## 2017-01-21 ENCOUNTER — Encounter: Payer: Self-pay | Admitting: Primary Care

## 2017-01-21 DIAGNOSIS — M79671 Pain in right foot: Secondary | ICD-10-CM

## 2017-01-21 DIAGNOSIS — M109 Gout, unspecified: Secondary | ICD-10-CM

## 2017-01-24 MED ORDER — PREDNISONE 20 MG PO TABS: ORAL_TABLET | ORAL | 0 refills | Status: DC

## 2017-02-05 ENCOUNTER — Encounter: Payer: Self-pay | Admitting: Primary Care

## 2017-02-05 DIAGNOSIS — F411 Generalized anxiety disorder: Secondary | ICD-10-CM

## 2017-02-06 MED ORDER — VENLAFAXINE HCL ER 75 MG PO CP24: 75.0000 mg | ORAL_CAPSULE | Freq: Every day | ORAL | 1 refills | Status: DC

## 2017-02-21 ENCOUNTER — Other Ambulatory Visit: Payer: Self-pay | Admitting: Primary Care

## 2017-02-21 DIAGNOSIS — J309 Allergic rhinitis, unspecified: Secondary | ICD-10-CM

## 2017-03-17 ENCOUNTER — Encounter: Payer: Self-pay | Admitting: Primary Care

## 2017-03-17 DIAGNOSIS — F411 Generalized anxiety disorder: Secondary | ICD-10-CM

## 2017-03-18 MED ORDER — VENLAFAXINE HCL ER 75 MG PO CP24: 75.0000 mg | ORAL_CAPSULE | Freq: Every day | ORAL | 1 refills | Status: DC

## 2017-06-10 ENCOUNTER — Ambulatory Visit: Payer: Managed Care, Other (non HMO) | Admitting: Family Medicine

## 2017-06-10 ENCOUNTER — Encounter: Payer: Self-pay | Admitting: Family Medicine

## 2017-06-10 VITALS — BP 120/84 | HR 100 | Temp 98.9°F | Wt 232.6 lb

## 2017-06-10 DIAGNOSIS — J069 Acute upper respiratory infection, unspecified: Secondary | ICD-10-CM | POA: Diagnosis not present

## 2017-06-10 DIAGNOSIS — H66003 Acute suppurative otitis media without spontaneous rupture of ear drum, bilateral: Secondary | ICD-10-CM

## 2017-06-10 MED ORDER — AMOXICILLIN-POT CLAVULANATE 875-125 MG PO TABS: 1.0000 | ORAL_TABLET | Freq: Two times a day (BID) | ORAL | 0 refills | Status: DC

## 2017-06-10 NOTE — Progress Notes (Signed)
Subjective:     Patient ID: Nathaniel Frank, male   DOB: 12/03/1985, 32 y.o.   MRN: [id number]  HPI Patient seen with onset Monday of sinus pressure and occasional cough. Had some intermittent chills. Has not taken his temperature. He has sinus pressure frontal and maxillary region. He's had some increasing left ear fullness greater than right since just her day. He had leftover amoxicillin and started 500 mg 3 times a day couple days ago. Denies any nausea or vomiting.  Past Medical History:  Diagnosis Date  . Elevated blood pressure reading   . GAD (generalized anxiety disorder)   . PVC (premature ventricular contraction)    Past Surgical History:  Procedure Laterality Date  . TONSILLECTOMY AND ADENOIDECTOMY  1999    reports that  has never smoked. he has never used smokeless tobacco. He reports that he drinks alcohol. His drug history is not on file. family history includes Renal cancer in his father. No Known Allergies   Review of Systems  Constitutional: Positive for chills and fatigue.  HENT: Positive for congestion, ear pain, sinus pressure and sinus pain. Negative for sore throat.   Respiratory: Positive for cough.        Objective:   Physical Exam  Constitutional: He appears well-developed and well-nourished.  HENT:  Mouth/Throat: Oropharynx is clear and moist.  Patient has erythema of both eardrums left greater than right. He has some yellowish discoloration behind the inferior portion of left eardrum greater than right. No visible perforation  Neck: Neck supple.  Cardiovascular: Normal rate and regular rhythm.  Pulmonary/Chest: Effort normal and breath sounds normal. No respiratory distress. He has no wheezes. He has no rales.  Lymphadenopathy:    He has no cervical adenopathy.       Assessment:     Probable viral URI. He does have some probably early changes of bilateral suppurative otitis    Plan:     -Continue with symptomatic treatment and good  hydration -We elected to broaden coverage to Augmentin 875 mg twice daily for 10 days with ear changes above -Follow-up with primary for any persistent or worsening symptoms  Kristian CoveyBruce W Deven Audi MD [facility]

## 2017-06-10 NOTE — Patient Instructions (Signed)

## 2017-08-20 ENCOUNTER — Other Ambulatory Visit: Payer: Self-pay | Admitting: Primary Care

## 2017-08-20 DIAGNOSIS — F411 Generalized anxiety disorder: Secondary | ICD-10-CM

## 2017-08-28 ENCOUNTER — Encounter: Payer: Self-pay | Admitting: Primary Care

## 2017-09-11 ENCOUNTER — Other Ambulatory Visit: Payer: Self-pay | Admitting: Primary Care

## 2017-09-11 DIAGNOSIS — F411 Generalized anxiety disorder: Secondary | ICD-10-CM

## 2017-09-15 NOTE — Telephone Encounter (Signed)
Patient last seen on 01/09/18 and due an annual exam after 10/14/17.  He does have a current 3 months supply per 08/28/17 email communication.  Will refill venlafaxine XR for #90 only and have office assistant contact patient to schedule annual follow up in upcoming months.   Nathaniel Frank,  Can you please call patient and set up his annual exam AFTER 6/26?  Thanks.

## 2017-09-16 NOTE — Telephone Encounter (Signed)
Talked with patient. He wants to call back to schedule after he knows when he can take time off.

## 2017-10-16 ENCOUNTER — Ambulatory Visit: Payer: 59 | Admitting: Psychology

## 2017-10-16 DIAGNOSIS — F411 Generalized anxiety disorder: Secondary | ICD-10-CM

## 2017-10-23 ENCOUNTER — Ambulatory Visit: Payer: 59 | Admitting: Psychology

## 2017-10-23 DIAGNOSIS — F411 Generalized anxiety disorder: Secondary | ICD-10-CM

## 2017-11-06 ENCOUNTER — Ambulatory Visit: Payer: 59 | Admitting: Psychology

## 2017-11-06 DIAGNOSIS — F411 Generalized anxiety disorder: Secondary | ICD-10-CM | POA: Diagnosis not present

## 2017-11-20 ENCOUNTER — Ambulatory Visit: Payer: 59 | Admitting: Psychology

## 2017-11-20 DIAGNOSIS — F411 Generalized anxiety disorder: Secondary | ICD-10-CM | POA: Diagnosis not present

## 2017-12-07 ENCOUNTER — Ambulatory Visit: Payer: 59 | Admitting: Psychology

## 2017-12-07 DIAGNOSIS — F411 Generalized anxiety disorder: Secondary | ICD-10-CM

## 2017-12-16 ENCOUNTER — Ambulatory Visit (INDEPENDENT_AMBULATORY_CARE_PROVIDER_SITE_OTHER): Payer: Managed Care, Other (non HMO) | Admitting: Primary Care

## 2017-12-16 VITALS — BP 124/84 | HR 70 | Temp 98.2°F | Ht 70.0 in | Wt 232.5 lb

## 2017-12-16 DIAGNOSIS — Z23 Encounter for immunization: Secondary | ICD-10-CM

## 2017-12-16 DIAGNOSIS — R030 Elevated blood-pressure reading, without diagnosis of hypertension: Secondary | ICD-10-CM | POA: Diagnosis not present

## 2017-12-16 DIAGNOSIS — F411 Generalized anxiety disorder: Secondary | ICD-10-CM

## 2017-12-16 DIAGNOSIS — M1A9XX0 Chronic gout, unspecified, without tophus (tophi): Secondary | ICD-10-CM | POA: Diagnosis not present

## 2017-12-16 DIAGNOSIS — Z0000 Encounter for general adult medical examination without abnormal findings: Secondary | ICD-10-CM

## 2017-12-16 MED ORDER — VENLAFAXINE HCL ER 75 MG PO CP24: ORAL_CAPSULE | ORAL | 3 refills | Status: DC

## 2017-12-16 NOTE — Assessment & Plan Note (Signed)
Tdap due, provided today. Recommended to increase exercise, work on diet. Exam unremarkable. Labs pending. Follow up in 1 year for CPE.

## 2017-12-16 NOTE — Assessment & Plan Note (Addendum)
No recent flares. Continue to monitor. Uric acid pending today.

## 2017-12-16 NOTE — Assessment & Plan Note (Signed)
BP normal in the office today, continue to monitor.

## 2017-12-16 NOTE — Addendum Note (Signed)
Addended by: Tawnya CrookSAMBATH, Murvin Gift on: 12/16/2017 05:08 PM   Modules accepted: Orders

## 2017-12-16 NOTE — Patient Instructions (Signed)
Stop by the lab prior to leaving today. I will notify you of your results once received.   Continue exercising. You should be getting 150 minutes of moderate intensity exercise weekly.  Make sure to eat plenty of vegetables, fruit, whole grains, lean protein.  Ensure you are consuming 64 ounces of water daily.  You were provided with a tetanus vaccination which will cover you for 10 years.  We will see you in 1 year for your annual exam or sooner if needed.  It was a pleasure to see you today!   Preventive Care 18-39 Years, Male Preventive care refers to lifestyle choices and visits with your health care provider that can promote health and wellness. What does preventive care include?  A yearly physical exam. This is also called an annual well check.  Dental exams once or twice a year.  Routine eye exams. Ask your health care provider how often you should have your eyes checked.  Personal lifestyle choices, including: ? Daily care of your teeth and gums. ? Regular physical activity. ? Eating a healthy diet. ? Avoiding tobacco and drug use. ? Limiting alcohol use. ? Practicing safe sex. What happens during an annual well check? The services and screenings done by your health care provider during your annual well check will depend on your age, overall health, lifestyle risk factors, and family history of disease. Counseling Your health care provider may ask you questions about your:  Alcohol use.  Tobacco use.  Drug use.  Emotional well-being.  Home and relationship well-being.  Sexual activity.  Eating habits.  Work and work Statistician.  Screening You may have the following tests or measurements:  Height, weight, and BMI.  Blood pressure.  Lipid and cholesterol levels. These may be checked every 5 years starting at age 76.  Diabetes screening. This is done by checking your blood sugar (glucose) after you have not eaten for a while (fasting).  Skin  check.  Hepatitis C blood test.  Hepatitis B blood test.  Sexually transmitted disease (STD) testing.  Discuss your test results, treatment options, and if necessary, the need for more tests with your health care provider. Vaccines Your health care provider may recommend certain vaccines, such as:  Influenza vaccine. This is recommended every year.  Tetanus, diphtheria, and acellular pertussis (Tdap, Td) vaccine. You may need a Td booster every 10 years.  Varicella vaccine. You may need this if you have not been vaccinated.  HPV vaccine. If you are 77 or younger, you may need three doses over 6 months.  Measles, mumps, and rubella (MMR) vaccine. You may need at least one dose of MMR.You may also need a second dose.  Pneumococcal 13-valent conjugate (PCV13) vaccine. You may need this if you have certain conditions and have not been vaccinated.  Pneumococcal polysaccharide (PPSV23) vaccine. You may need one or two doses if you smoke cigarettes or if you have certain conditions.  Meningococcal vaccine. One dose is recommended if you are age 71-21 years and a first-year college student living in a residence hall, or if you have one of several medical conditions. You may also need additional booster doses.  Hepatitis A vaccine. You may need this if you have certain conditions or if you travel or work in places where you may be exposed to hepatitis A.  Hepatitis B vaccine. You may need this if you have certain conditions or if you travel or work in places where you may be exposed to hepatitis B.  Haemophilus influenzae type b (Hib) vaccine. You may need this if you have certain risk factors.  Talk to your health care provider about which screenings and vaccines you need and how often you need them. This information is not intended to replace advice given to you by your health care provider. Make sure you discuss any questions you have with your health care provider. Document Released:  06/03/2001 Document Revised: 12/26/2015 Document Reviewed: 02/06/2015 Elsevier Interactive Patient Education  [employer].

## 2017-12-16 NOTE — Progress Notes (Signed)
Subjective:    Patient ID: Nathaniel Frank, male    DOB: 03/24/1986, 32 y.o.   MRN: [id number]  HPI  Mr. Nathaniel Frank is a 32 year old male who presents today for complete physical.  Immunizations: -Tetanus: Due -Influenza: Due this season   Diet: He endorses a fair diet Breakfast: Skips Lunch: Crackers Dinner: [employer] pie, vegetables, meat, starch Snacks: Crackers Desserts: None Beverages: Coffee, water, wine, beer  Exercise: He is doing 30 minutes of yard work daily Eye exam: Completed in 2019 Dental exam: No recent exam  BP Readings from Last 3 Encounters:  12/16/17 124/84  06/10/17 120/84  01/09/17 124/84      Review of Systems  Constitutional: Negative for unexpected weight change.  HENT: Negative for rhinorrhea.   Respiratory: Negative for cough and shortness of breath.   Cardiovascular: Negative for chest pain.  Gastrointestinal: Negative for constipation and diarrhea.  Genitourinary: Negative for difficulty urinating.  Musculoskeletal: Negative for arthralgias and myalgias.  Skin: Negative for rash.  Allergic/Immunologic: Negative for environmental allergies.  Neurological: Negative for dizziness, numbness and headaches.  Psychiatric/Behavioral:       Overall feels well on Effexor and therapy.       Past Medical History:  Diagnosis Date  . Elevated blood pressure reading   . GAD (generalized anxiety disorder)   . PVC (premature ventricular contraction)      Social History   Socioeconomic History  . Marital status: Legally Separated    Spouse name: Not on file  . Number of children: Not on file  . Years of education: Not on file  . Highest education level: Not on file  Occupational History  . Not on file  Social Needs  . Financial resource strain: Not on file  . Food insecurity:    Worry: Not on file    Inability: Not on file  . Transportation needs:    Medical: Not on file    Non-medical: Not on file  Tobacco Use  . Smoking status: Never  Smoker  . Smokeless tobacco: Never Used  Substance and Sexual Activity  . Alcohol use: Yes    Comment: soical  . Drug use: Not on file  . Sexual activity: Not on file  Lifestyle  . Physical activity:    Days per week: Not on file    Minutes per session: Not on file  . Stress: Not on file  Relationships  . Social connections:    Talks on phone: Not on file    Gets together: Not on file    Attends religious service: Not on file    Active member of club or organization: Not on file    Attends meetings of clubs or organizations: Not on file    Relationship status: Not on file  . Intimate partner violence:    Fear of current or ex partner: Not on file    Emotionally abused: Not on file    Physically abused: Not on file    Forced sexual activity: Not on file  Other Topics Concern  . Not on file  Social History Narrative   Engaged.   3 children.   Works for [employer]   Enjoys golfing.     Past Surgical History:  Procedure Laterality Date  . TONSILLECTOMY AND ADENOIDECTOMY  1999    Family History  Problem Relation Age of Onset  . Renal cancer Father     No Known Allergies  Current Outpatient Medications on File Prior to Visit  Medication  Sig Dispense Refill  . triamcinolone (NASACORT) 55 MCG/ACT AERO nasal inhaler Place 2 sprays into the nose daily.     No current facility-administered medications on file prior to visit.     BP 124/84   Pulse 70   Temp 98.2 F (36.8 C) (Oral)   Ht 5\' 10"  (1.778 m)   Wt 232 lb 8 oz (105.5 kg)   SpO2 98%   BMI 33.36 kg/m    Objective:   Physical Exam  Constitutional: He is oriented to person, place, and time. He appears well-nourished.  HENT:  Mouth/Throat: No oropharyngeal exudate.  Eyes: Pupils are equal, round, and reactive to light. EOM are normal.  Neck: Neck supple. No thyromegaly present.  Cardiovascular: Normal rate and regular rhythm.  Respiratory: Effort normal and breath sounds normal.  GI: Soft. Bowel sounds  are normal. There is no tenderness.  Musculoskeletal: Normal range of motion.  Neurological: He is alert and oriented to person, place, and time.  Skin: Skin is warm and dry.  Psychiatric: He has a normal mood and affect.           Assessment & Plan:

## 2017-12-16 NOTE — Assessment & Plan Note (Signed)
Overall doing well on Effexor and therapy, continue same. Denies SI/HI.

## 2017-12-17 LAB — HEMOGLOBIN A1C
Est. average glucose Bld gHb Est-mCnc: 108 mg/dL
HEMOGLOBIN A1C: 5.4 % (ref 4.8–5.6)

## 2017-12-17 LAB — COMPREHENSIVE METABOLIC PANEL
ALT: 31 IU/L (ref 0–44)
AST: 18 IU/L (ref 0–40)
Albumin/Globulin Ratio: 2.2 (ref 1.2–2.2)
Albumin: 5 g/dL (ref 3.5–5.5)
Alkaline Phosphatase: 48 IU/L (ref 39–117)
BUN / CREAT RATIO: 14 (ref 9–20)
BUN: 12 mg/dL (ref 6–20)
Bilirubin Total: 0.5 mg/dL (ref 0.0–1.2)
CO2: 24 mmol/L (ref 20–29)
CREATININE: 0.87 mg/dL (ref 0.76–1.27)
Calcium: 9.8 mg/dL (ref 8.7–10.2)
Chloride: 101 mmol/L (ref 96–106)
GFR, EST AFRICAN AMERICAN: 130 mL/min/{1.73_m2} (ref >59)
GFR, EST NON AFRICAN AMERICAN: 113 mL/min/{1.73_m2} (ref >59)
Globulin, Total: 2.3 g/dL (ref 1.5–4.5)
Glucose: 124 mg/dL — ABNORMAL HIGH (ref 65–99)
Potassium: 4.3 mmol/L (ref 3.5–5.2)
Sodium: 140 mmol/L (ref 134–144)
TOTAL PROTEIN: 7.3 g/dL (ref 6.0–8.5)

## 2017-12-17 LAB — URIC ACID: Uric Acid: 8.5 mg/dL (ref 3.7–8.6)

## 2017-12-17 LAB — LIPID PANEL
CHOLESTEROL TOTAL: 290 mg/dL — AB (ref 100–199)
Chol/HDL Ratio: 6.3 ratio — ABNORMAL HIGH (ref 0.0–5.0)
HDL: 46 mg/dL (ref >39)
LDL CALC: 186 mg/dL — AB (ref 0–99)
TRIGLYCERIDES: 289 mg/dL — AB (ref 0–149)
VLDL CHOLESTEROL CAL: 58 mg/dL — AB (ref 5–40)

## 2017-12-18 ENCOUNTER — Encounter: Payer: Self-pay | Admitting: Primary Care

## 2017-12-23 ENCOUNTER — Ambulatory Visit: Payer: 59 | Admitting: Psychology

## 2018-02-01 ENCOUNTER — Telehealth: Payer: Self-pay | Admitting: Primary Care

## 2018-02-01 NOTE — Telephone Encounter (Signed)
Place form/paperwork in Nathaniel Frank's inbox for review and complete if necessary.  

## 2018-02-01 NOTE — Telephone Encounter (Signed)
Pt dropped off a health screening form. Placed in Rx tower.

## 2018-02-01 NOTE — Telephone Encounter (Signed)
Completed and placed in Chans inbox. 

## 2018-02-02 NOTE — Telephone Encounter (Signed)
Message left for patient to return my call.  Form is ready for pick up. Left in the front office.

## 2018-02-10 ENCOUNTER — Ambulatory Visit: Payer: 59 | Admitting: Psychology

## 2018-02-10 DIAGNOSIS — F411 Generalized anxiety disorder: Secondary | ICD-10-CM

## 2018-02-24 ENCOUNTER — Ambulatory Visit: Payer: Self-pay | Admitting: Psychology

## 2018-12-06 ENCOUNTER — Encounter: Payer: Self-pay | Admitting: Primary Care

## 2018-12-06 ENCOUNTER — Ambulatory Visit (INDEPENDENT_AMBULATORY_CARE_PROVIDER_SITE_OTHER): Payer: Managed Care, Other (non HMO) | Admitting: Primary Care

## 2018-12-06 ENCOUNTER — Other Ambulatory Visit: Payer: Self-pay

## 2018-12-06 VITALS — BP 126/78 | HR 72 | Temp 97.8°F | Ht 70.0 in | Wt 213.5 lb

## 2018-12-06 DIAGNOSIS — J309 Allergic rhinitis, unspecified: Secondary | ICD-10-CM

## 2018-12-06 DIAGNOSIS — Z0000 Encounter for general adult medical examination without abnormal findings: Secondary | ICD-10-CM

## 2018-12-06 DIAGNOSIS — F411 Generalized anxiety disorder: Secondary | ICD-10-CM

## 2018-12-06 DIAGNOSIS — M1A9XX0 Chronic gout, unspecified, without tophus (tophi): Secondary | ICD-10-CM | POA: Diagnosis not present

## 2018-12-06 MED ORDER — VENLAFAXINE HCL ER 75 MG PO CP24: ORAL_CAPSULE | ORAL | 3 refills | Status: DC

## 2018-12-06 NOTE — Patient Instructions (Signed)
Stop by the lab prior to leaving today. I will notify you of your results once received.   Start exercising. You should be getting 150 minutes of moderate intensity exercise weekly.  Continue to work on a healthy diet.  Ensure you are drinking 64 ounces of water daily.  It was a pleasure to see you today!   Preventive Care 19-35 Years Old, Male Preventive care refers to lifestyle choices and visits with your health care provider that can promote health and wellness. This includes:  A yearly physical exam. This is also called an annual well check.  Regular dental and eye exams.  Immunizations.  Screening for certain conditions.  Healthy lifestyle choices, such as eating a healthy diet, getting regular exercise, not using drugs or products that contain nicotine and tobacco, and limiting alcohol use. What can I expect for my preventive care visit? Physical exam Your health care provider will check:  Height and weight. These may be used to calculate body mass index (BMI), which is a measurement that tells if you are at a healthy weight.  Heart rate and blood pressure.  Your skin for abnormal spots. Counseling Your health care provider may ask you questions about:  Alcohol, tobacco, and drug use.  Emotional well-being.  Home and relationship well-being.  Sexual activity.  Eating habits.  Work and work Statistician. What immunizations do I need?  Influenza (flu) vaccine  This is recommended every year. Tetanus, diphtheria, and pertussis (Tdap) vaccine  You may need a Td booster every 10 years. Varicella (chickenpox) vaccine  You may need this vaccine if you have not already been vaccinated. Human papillomavirus (HPV) vaccine  If recommended by your health care provider, you may need three doses over 6 months. Measles, mumps, and rubella (MMR) vaccine  You may need at least one dose of MMR. You may also need a second dose. Meningococcal conjugate (MenACWY)  vaccine  One dose is recommended if you are 66-61 years old and a Market researcher living in a residence hall, or if you have one of several medical conditions. You may also need additional booster doses. Pneumococcal conjugate (PCV13) vaccine  You may need this if you have certain conditions and were not previously vaccinated. Pneumococcal polysaccharide (PPSV23) vaccine  You may need one or two doses if you smoke cigarettes or if you have certain conditions. Hepatitis A vaccine  You may need this if you have certain conditions or if you travel or work in places where you may be exposed to hepatitis A. Hepatitis B vaccine  You may need this if you have certain conditions or if you travel or work in places where you may be exposed to hepatitis B. Haemophilus influenzae type b (Hib) vaccine  You may need this if you have certain risk factors. You may receive vaccines as individual doses or as more than one vaccine together in one shot (combination vaccines). Talk with your health care provider about the risks and benefits of combination vaccines. What tests do I need? Blood tests  Lipid and cholesterol levels. These may be checked every 5 years starting at age 73.  Hepatitis C test.  Hepatitis B test. Screening   Diabetes screening. This is done by checking your blood sugar (glucose) after you have not eaten for a while (fasting).  Sexually transmitted disease (STD) testing. Talk with your health care provider about your test results, treatment options, and if necessary, the need for more tests. Follow these instructions at home: Eating and  drinking   Eat a diet that includes fresh fruits and vegetables, whole grains, lean protein, and low-fat dairy products.  Take vitamin and mineral supplements as recommended by your health care provider.  Do not drink alcohol if your health care provider tells you not to drink.  If you drink alcohol: ? Limit how much you have  to 0-2 drinks a day. ? Be aware of how much alcohol is in your drink. In the [place] one drink equals one 12 oz bottle of beer (355 mL), one 5 oz glass of wine (148 mL), or one 1 oz glass of hard liquor (44 mL). Lifestyle  Take daily care of your teeth and gums.  Stay active. Exercise for at least 30 minutes on 5 or more days each week.  Do not use any products that contain nicotine or tobacco, such as cigarettes, e-cigarettes, and chewing tobacco. If you need help quitting, ask your health care provider.  If you are sexually active, practice safe sex. Use a condom or other form of protection to prevent STIs (sexually transmitted infections). What's next?  Go to your health care provider once a year for a well check visit.  Ask your health care provider how often you should have your eyes and teeth checked.  Stay up to date on all vaccines. This information is not intended to replace advice given to you by your health care provider. Make sure you discuss any questions you have with your health care provider. Document Released: 06/03/2001 Document Revised: 04/01/2018 Document Reviewed: 04/01/2018 Elsevier Patient Education  2020 [employer].

## 2018-12-06 NOTE — Progress Notes (Signed)
Subjective:    Patient ID: Nathaniel Frank, male    DOB: 11/22/1985, 33 y.o.   MRN: [id number]  HPI  Nathaniel Frank is a 33 year old male who presents today for complete physical.  Immunizations: -Tetanus: Completed in 2019 -Influenza: Due this season   Diet: He endorses a healthy diet. He cooks mostly, grilled lean protein/vegetables, starch. Desserts infrequently. He drinks mostly coffee, water, wine/beer.  Exercise: He is gardening, no regular exercise.  Eye exam: Completed 2 years ago. Dental exam: No recent exam  BP Readings from Last 3 Encounters:  12/16/17 124/84  06/10/17 120/84  01/09/17 124/84     Review of Systems  Constitutional: Negative for unexpected weight change.  HENT: Negative for rhinorrhea.   Respiratory: Negative for cough and shortness of breath.   Cardiovascular: Negative for chest pain.  Gastrointestinal: Negative for constipation and diarrhea.  Genitourinary: Negative for difficulty urinating.  Musculoskeletal: Negative for arthralgias and myalgias.  Skin: Negative for rash.  Allergic/Immunologic: Negative for environmental allergies.  Neurological: Negative for dizziness, numbness and headaches.  Psychiatric/Behavioral: The patient is not nervous/anxious.        Past Medical History:  Diagnosis Date  . Elevated blood pressure reading   . GAD (generalized anxiety disorder)   . PVC (premature ventricular contraction)      Social History   Socioeconomic History  . Marital status: Legally Separated    Spouse name: Not on file  . Number of children: Not on file  . Years of education: Not on file  . Highest education level: Not on file  Occupational History  . Not on file  Social Needs  . Financial resource strain: Not on file  . Food insecurity    Worry: Not on file    Inability: Not on file  . Transportation needs    Medical: Not on file    Non-medical: Not on file  Tobacco Use  . Smoking status: Never Smoker  . Smokeless tobacco:  Never Used  Substance and Sexual Activity  . Alcohol use: Yes    Comment: soical  . Drug use: Not on file  . Sexual activity: Not on file  Lifestyle  . Physical activity    Days per week: Not on file    Minutes per session: Not on file  . Stress: Not on file  Relationships  . Social Herbalist on phone: Not on file    Gets together: Not on file    Attends religious service: Not on file    Active member of club or organization: Not on file    Attends meetings of clubs or organizations: Not on file    Relationship status: Not on file  . Intimate partner violence    Fear of current or ex partner: Not on file    Emotionally abused: Not on file    Physically abused: Not on file    Forced sexual activity: Not on file  Other Topics Concern  . Not on file  Social History Narrative   Engaged.   3 children.   Works for [employer].   Enjoys golfing.     Past Surgical History:  Procedure Laterality Date  . TONSILLECTOMY AND ADENOIDECTOMY  1999    Family History  Problem Relation Age of Onset  . Renal cancer Father     No Known Allergies  Current Outpatient Medications on File Prior to Visit  Medication Sig Dispense Refill  . triamcinolone (NASACORT) 55 MCG/ACT AERO nasal  inhaler Place 2 sprays into the nose daily.     No current facility-administered medications on file prior to visit.     BP 126/78   Pulse 72   Temp 97.8 F (36.6 C) (Temporal)   Ht 5\' 10"  (1.778 m)   Wt 213 lb 8 oz (96.8 kg)   SpO2 98%   BMI 30.63 kg/m    Objective:   Physical Exam  Constitutional: He is oriented to person, place, and time. He appears well-nourished.  HENT:  Mouth/Throat: No oropharyngeal exudate.  Eyes: Pupils are equal, round, and reactive to light. EOM are normal.  Neck: Neck supple. No thyromegaly present.  Cardiovascular: Normal rate and regular rhythm.  Respiratory: Effort normal and breath sounds normal.  GI: Soft. Bowel sounds are normal. There is no  abdominal tenderness.  Musculoskeletal: Normal range of motion.  Neurological: He is alert and oriented to person, place, and time.  Skin: Skin is warm and dry.  Psychiatric: He has a normal mood and affect.           Assessment & Plan:

## 2018-12-06 NOTE — Assessment & Plan Note (Signed)
Chronic, intermittent. Doing well on OTC treatment, continue same.

## 2018-12-06 NOTE — Assessment & Plan Note (Signed)
Doing well on venlafaxine, denies SI/HI. Continue same, refills sent to pharmacy.

## 2018-12-06 NOTE — Assessment & Plan Note (Signed)
No recent flares, repeat Uric Acid level pending.

## 2018-12-06 NOTE — Assessment & Plan Note (Signed)
Immunizations UTD. Encouraged a healthy diet and regular exercise. Exam today unremarkable. Labs pending. Follow up in 1 year.

## 2018-12-07 LAB — COMPREHENSIVE METABOLIC PANEL
ALT: 23 IU/L (ref 0–44)
AST: 17 IU/L (ref 0–40)
Albumin/Globulin Ratio: 2.2 (ref 1.2–2.2)
Albumin: 4.7 g/dL (ref 4.0–5.0)
Alkaline Phosphatase: 46 IU/L (ref 39–117)
BUN/Creatinine Ratio: 14 (ref 9–20)
BUN: 13 mg/dL (ref 6–20)
Bilirubin Total: 0.4 mg/dL (ref 0.0–1.2)
CO2: 24 mmol/L (ref 20–29)
Calcium: 9.8 mg/dL (ref 8.7–10.2)
Chloride: 102 mmol/L (ref 96–106)
Creatinine, Ser: 0.96 mg/dL (ref 0.76–1.27)
GFR calc Af Amer: 118 mL/min/{1.73_m2} (ref >59)
GFR calc non Af Amer: 102 mL/min/{1.73_m2} (ref >59)
Globulin, Total: 2.1 g/dL (ref 1.5–4.5)
Glucose: 124 mg/dL — ABNORMAL HIGH (ref 65–99)
Potassium: 4.7 mmol/L (ref 3.5–5.2)
Sodium: 142 mmol/L (ref 134–144)
Total Protein: 6.8 g/dL (ref 6.0–8.5)

## 2018-12-07 LAB — LIPID PANEL
Chol/HDL Ratio: 4.9 ratio (ref 0.0–5.0)
Cholesterol, Total: 239 mg/dL — ABNORMAL HIGH (ref 100–199)
HDL: 49 mg/dL (ref >39)
LDL Calculated: 166 mg/dL — ABNORMAL HIGH (ref 0–99)
Triglycerides: 121 mg/dL (ref 0–149)
VLDL Cholesterol Cal: 24 mg/dL (ref 5–40)

## 2018-12-07 LAB — SARS-COV-2 ANTIBODY, IGM: SARS-CoV-2 Antibody, IgM: NEGATIVE

## 2018-12-07 LAB — URIC ACID: Uric Acid: 8.7 mg/dL — ABNORMAL HIGH (ref 3.7–8.6)

## 2018-12-07 LAB — HEMOGLOBIN A1C
Est. average glucose Bld gHb Est-mCnc: 105 mg/dL
Hgb A1c MFr Bld: 5.3 % (ref 4.8–5.6)

## 2018-12-21 ENCOUNTER — Encounter: Payer: Managed Care, Other (non HMO) | Admitting: Primary Care

## 2019-03-10 ENCOUNTER — Other Ambulatory Visit: Payer: Self-pay

## 2019-03-10 DIAGNOSIS — Z20822 Contact with and (suspected) exposure to covid-19: Secondary | ICD-10-CM

## 2019-03-13 LAB — NOVEL CORONAVIRUS, NAA: SARS-CoV-2, NAA: NOT DETECTED

## 2019-03-13 IMAGING — DX DG FOOT COMPLETE 3+V*L*
3 series · 3 of 3 positions shown · non-contrast
Comparison: None.

CLINICAL DATA: Left foot pain, no known injury, initial encounter

EXAM:
LEFT FOOT - COMPLETE 3+ VIEW

[foot ap]
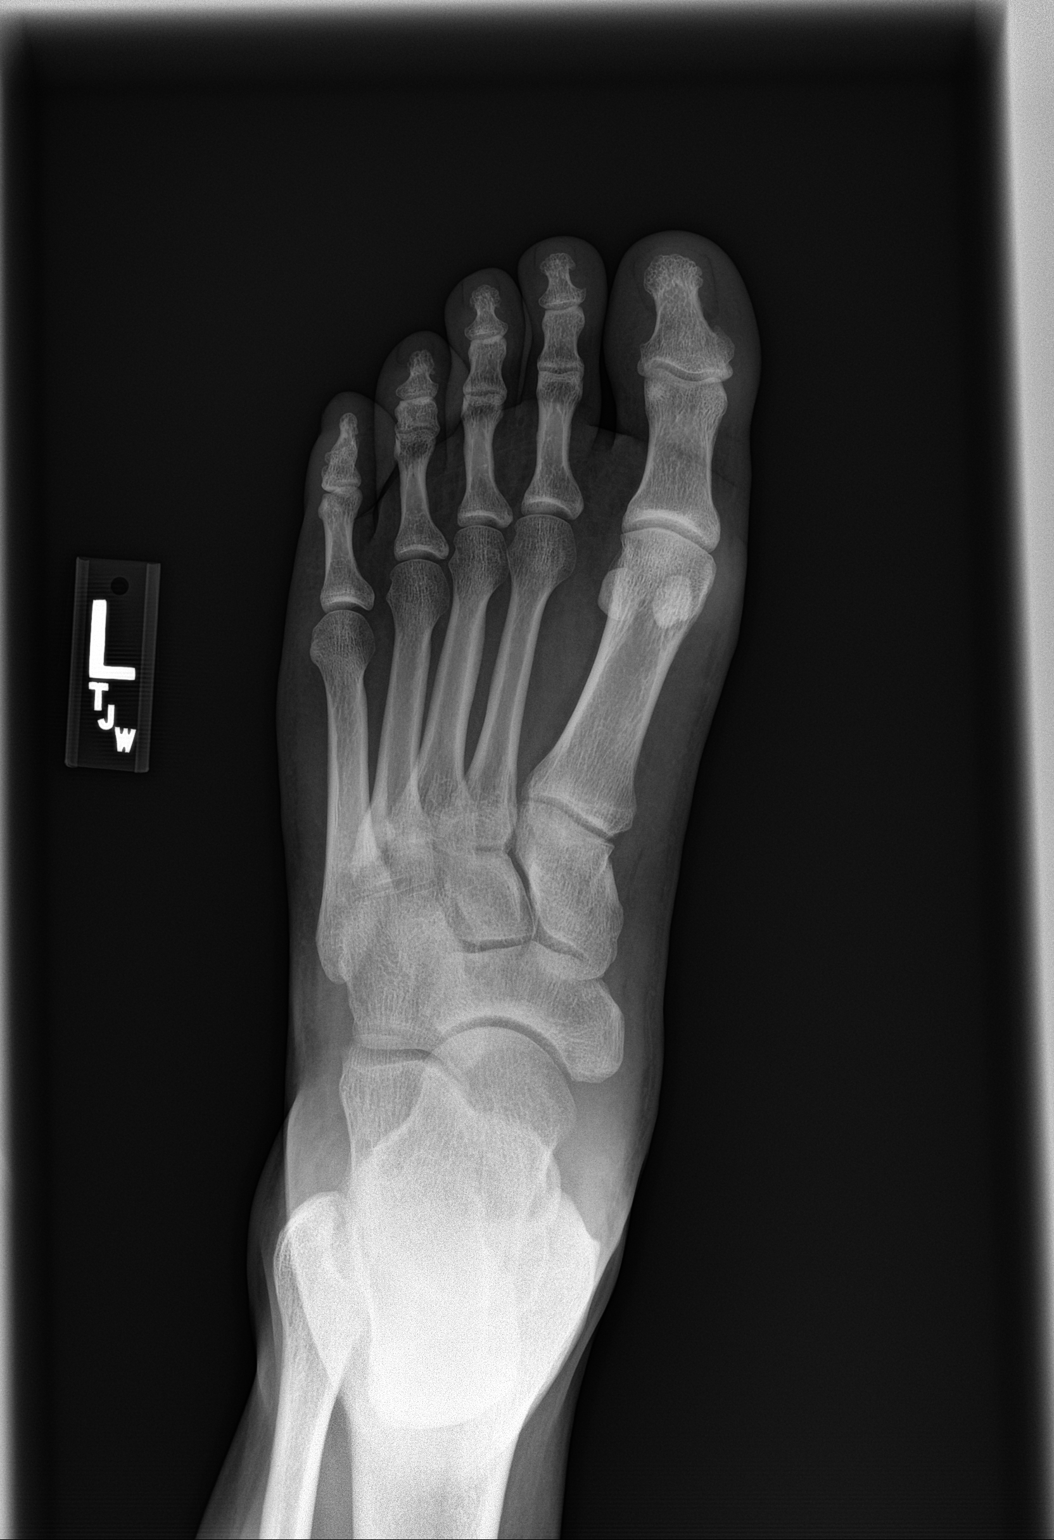

[foot obl]
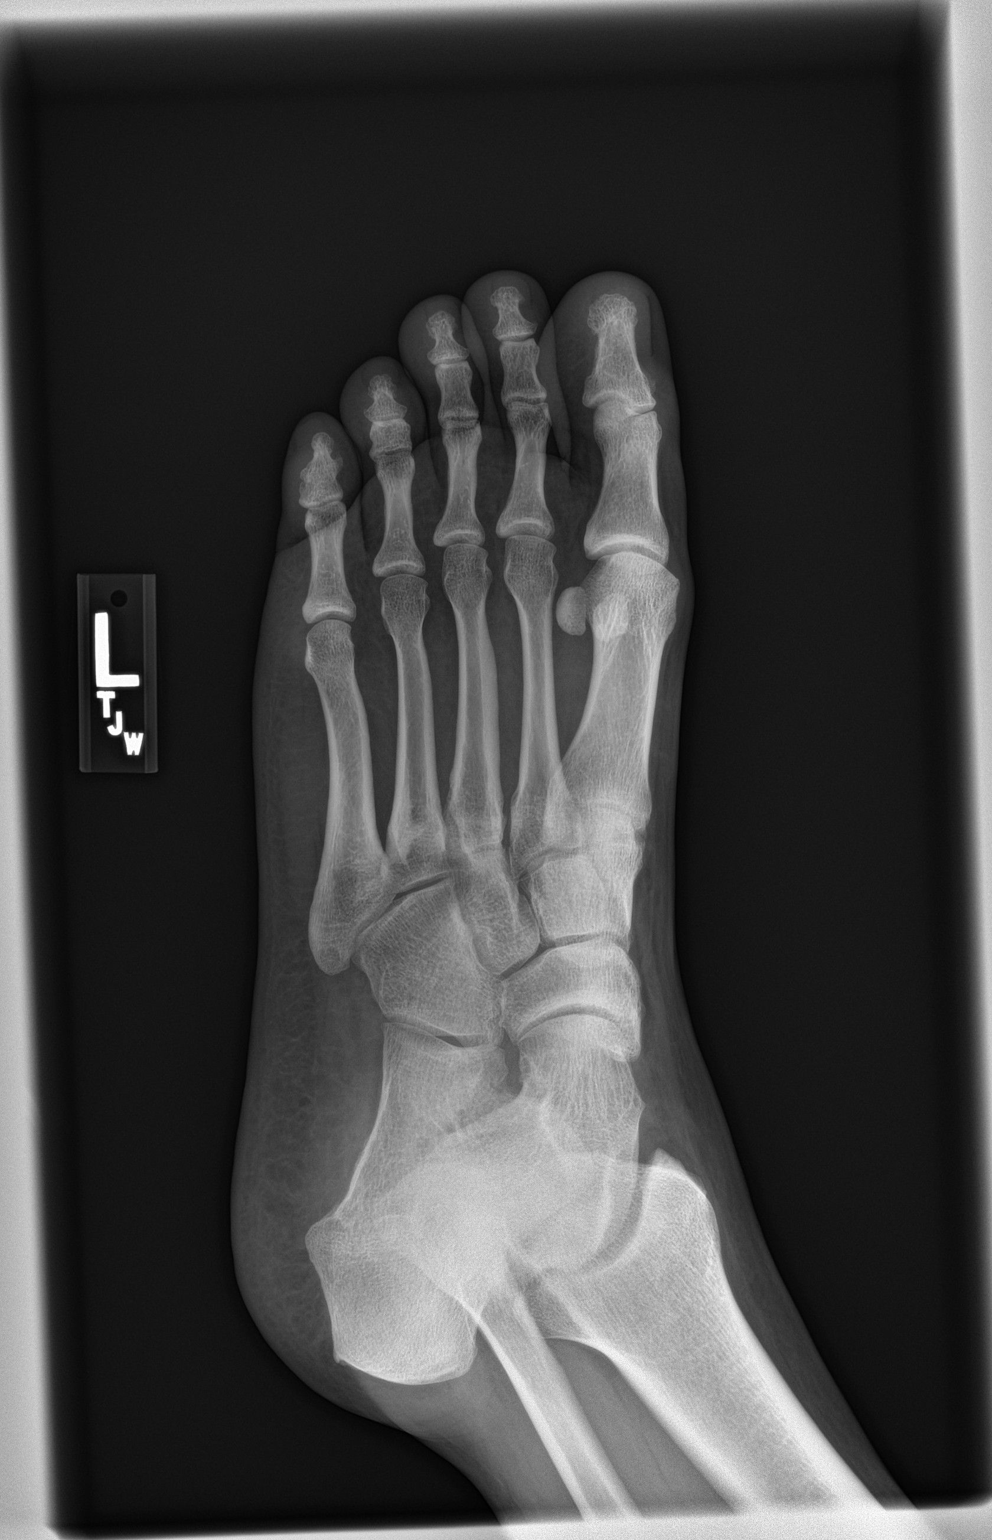

[foot lat]
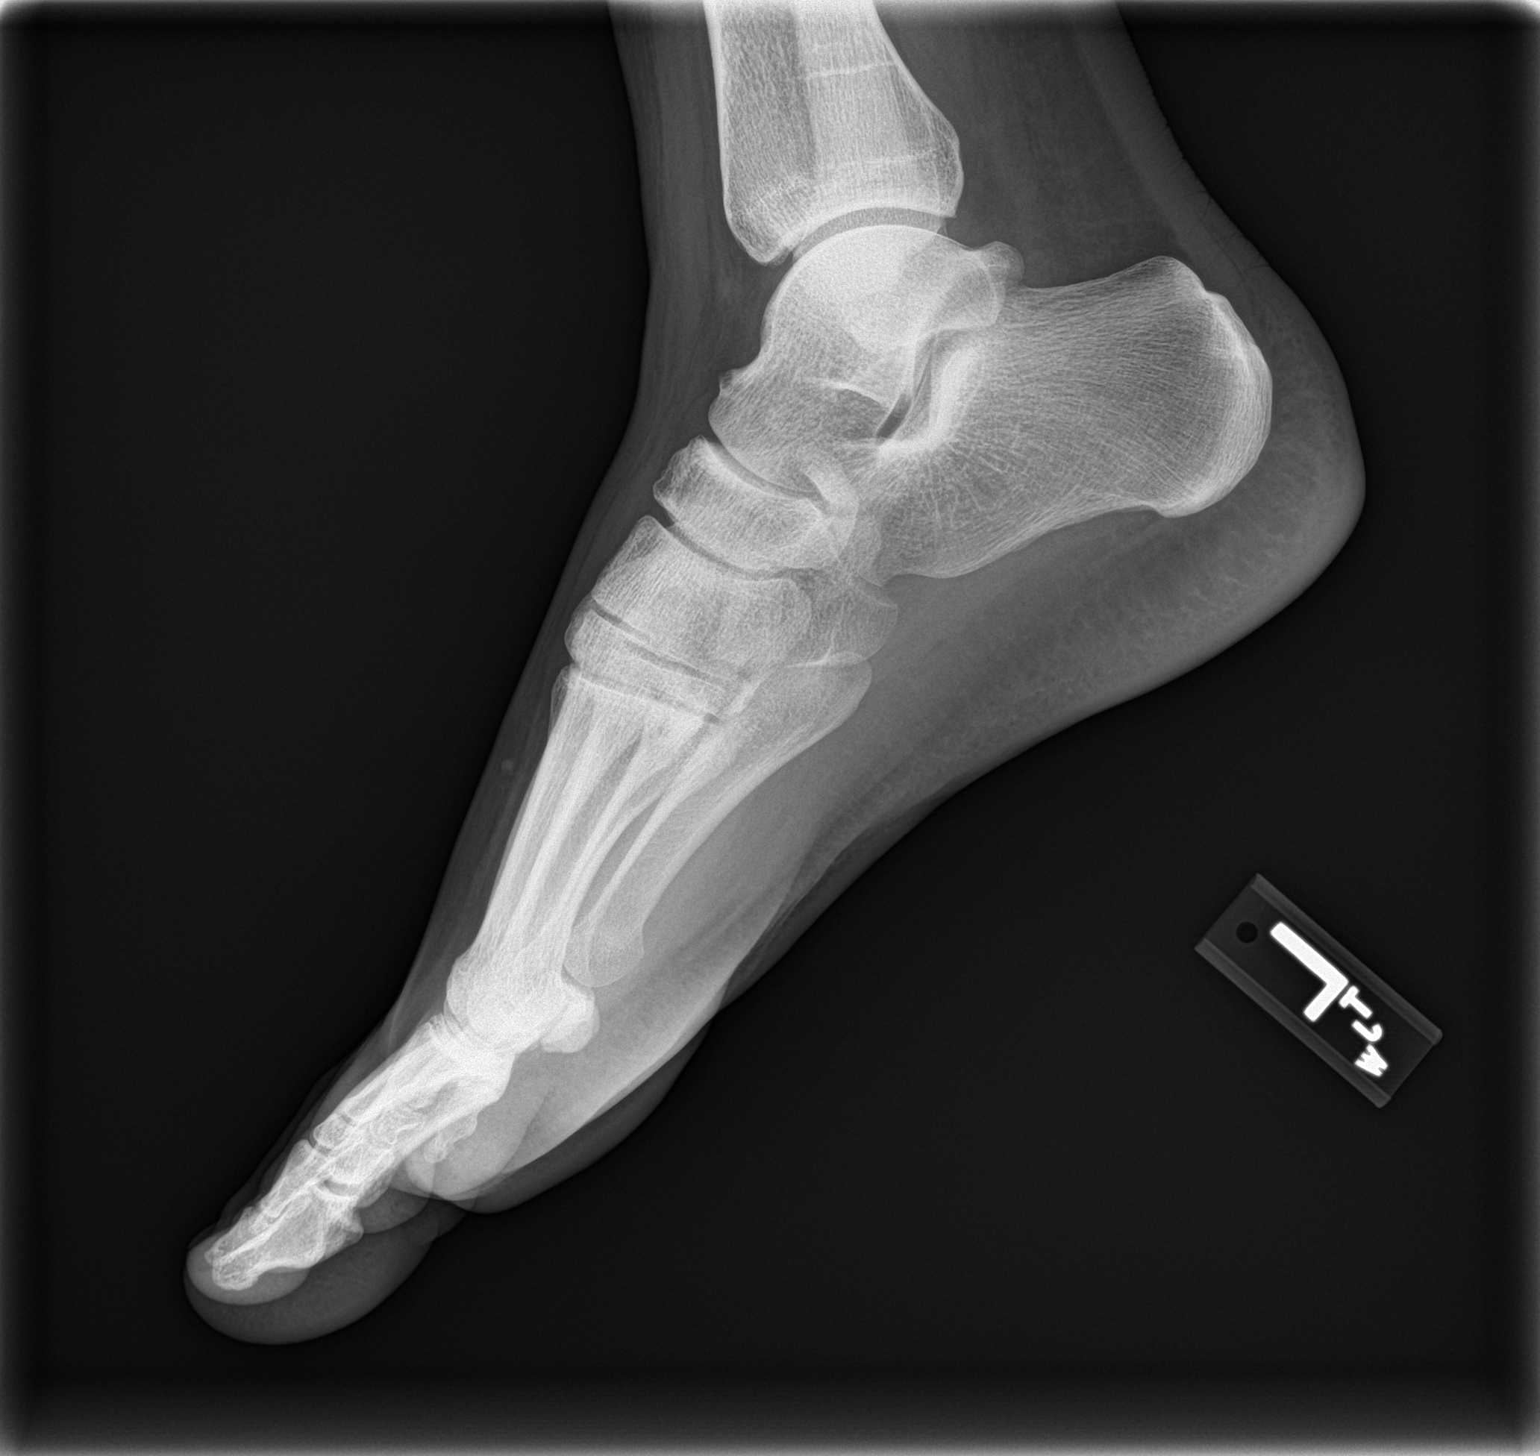

[3 of 3 positions shown; findings below may reference images not displayed]

FINDINGS: There is no evidence of fracture or dislocation. There is no
evidence of arthropathy or other focal bone abnormality. Soft
tissues are unremarkable.
IMPRESSION: No acute abnormality noted.

## 2019-05-03 ENCOUNTER — Ambulatory Visit
Admission: EM | Admit: 2019-05-03 | Discharge: 2019-05-03 | Disposition: A | Discharge status: Home / Self Care | Payer: Managed Care, Other (non HMO) | Attending: Emergency Medicine | Admitting: Emergency Medicine

## 2019-05-03 ENCOUNTER — Other Ambulatory Visit: Payer: Self-pay

## 2019-05-03 DIAGNOSIS — Z20822 Contact with and (suspected) exposure to covid-19: Secondary | ICD-10-CM

## 2019-05-03 LAB — POCT INFLUENZA A/B
Influenza A, POC: NEGATIVE
Influenza B, POC: NEGATIVE

## 2019-05-03 MED ORDER — FLUTICASONE PROPIONATE 50 MCG/ACT NA SUSP: 1.0000 | Freq: Every day | NASAL | 0 refills | Status: DC

## 2019-05-03 MED ORDER — ZYRTEC ALLERGY 10 MG PO CAPS: 10.0000 mg | ORAL_CAPSULE | ORAL | 0 refills | Status: DC

## 2019-05-03 NOTE — ED Triage Notes (Signed)
Pt suddenly developed symptoms yesterday with body aches and chills and scratchy throat with some mild SOB

## 2019-05-03 NOTE — ED Provider Notes (Addendum)
[facility]    CSN: [id number] Arrival date & time: 05/03/19  1101      History   Chief Complaint Chief Complaint  Patient presents with  . Chills  . Generalized Body Aches    HPI Nathaniel Frank is a 34 y.o. male.   Nathaniel Frank 34 years old male presented to the urgent care with a complaint of chills, shortness of breath, scratchy throat, and body aches for the past 1 day.  Denies sick exposure to COVID, flu or strep.  Denies recent travel.  Denies aggravating or alleviating symptoms.  Denies previous COVID infection.   Denies fever,  fatigue, nasal congestion, rhinorrhea, sore throat, cough, wheezing, chest pain, nausea, vomiting, changes in bowel or bladder habits.    The history is provided by the patient. No language interpreter was used.    Past Medical History:  Diagnosis Date  . Elevated blood pressure reading   . GAD (generalized anxiety disorder)   . PVC (premature ventricular contraction)     Patient Active Problem List   Diagnosis Date Noted  . Chronic gout 01/09/2017  . GAD (generalized anxiety disorder) 10/14/2016  . Preventative health care 10/14/2016  . Allergic rhinitis 01/04/2016  . Elevated blood pressure reading 01/04/2016    Past Surgical History:  Procedure Laterality Date  . TONSILLECTOMY AND ADENOIDECTOMY  1999       Home Medications    Prior to Admission medications   Medication Sig Start Date End Date Taking? Authorizing Provider  Cetirizine HCl (ZYRTEC ALLERGY) 10 MG CAPS Take 1 capsule (10 mg total) by mouth 1 day or 1 dose for 1 dose. 05/03/19 05/04/19  Christopher Glasscock, Zachery Dakins, FNP  fluticasone (FLONASE) 50 MCG/ACT nasal spray Place 1 spray into both nostrils daily for 14 days. 05/03/19 05/17/19  Reighlynn Swiney, Zachery Dakins, FNP  triamcinolone (NASACORT) 55 MCG/ACT AERO nasal inhaler Place 2 sprays into the nose daily.    [provider]  venlafaxine XR (EFFEXOR-XR) 75 MG 24 hr capsule TAKE 1 CAPSULE BY MOUTH  DAILY WITH  BREAKFAST 12/06/18   Doreene Nest, NP    Family History Family History  Problem Relation Age of Onset  . Renal cancer Father     Social History Social History   Tobacco Use  . Smoking status: Never Smoker  . Smokeless tobacco: Never Used  Substance Use Topics  . Alcohol use: Yes    Comment: soical  . Drug use: Not on file     Allergies   Patient has no known allergies.   Review of Systems Review of Systems  Constitutional: Negative.   HENT: Negative.   Respiratory: Negative.   Cardiovascular: Negative.   Gastrointestinal: Negative.   Neurological: Negative.   All other systems reviewed and are negative.    Physical Exam Triage Vital Signs ED Triage Vitals  Enc Vitals Group     BP 05/03/19 1116 (!) 163/110     Pulse Rate 05/03/19 1116 84     Resp 05/03/19 1116 18     Temp 05/03/19 1116 98.7 F (37.1 C)     Temp src --      SpO2 05/03/19 1116 97 %     Weight --      Height --      Head Circumference --      Peak Flow --      Pain Score 05/03/19 1114 2     Pain Loc --      Pain Edu? --  Excl. in GC? --    No data found.  Updated Vital Signs BP (!) 163/110   Pulse 84   Temp 98.7 F (37.1 C)   Resp 18   SpO2 97%   Visual Acuity Right Eye Distance:   Left Eye Distance:   Bilateral Distance:    Right Eye Near:   Left Eye Near:    Bilateral Near:     Physical Exam Vitals and nursing note reviewed.  Constitutional:      General: He is not in acute distress.    Appearance: Normal appearance. He is normal weight. He is not ill-appearing or toxic-appearing.  HENT:     Head: Normocephalic.     Right Ear: Tympanic membrane, ear canal and external ear normal. There is no impacted cerumen.     Left Ear: Tympanic membrane, ear canal and external ear normal. There is no impacted cerumen.     Nose: Nose normal. No congestion.     Mouth/Throat:     Mouth: Mucous membranes are moist.     Pharynx: Oropharynx is clear. No oropharyngeal  exudate or posterior oropharyngeal erythema.  Cardiovascular:     Rate and Rhythm: Normal rate and regular rhythm.     Pulses: Normal pulses.     Heart sounds: Normal heart sounds. No murmur.  Pulmonary:     Effort: Pulmonary effort is normal. No respiratory distress.     Breath sounds: Normal breath sounds. No wheezing or rhonchi.  Chest:     Chest wall: No tenderness.  Abdominal:     General: Abdomen is flat. Bowel sounds are normal. There is no distension.     Palpations: There is no mass.     Tenderness: There is no abdominal tenderness.  Skin:    Capillary Refill: Capillary refill takes less than 2 seconds.  Neurological:     General: No focal deficit present.     Mental Status: He is alert and oriented to person, place, and time.      UC Treatments / Results  Labs (all labs ordered are listed, but only abnormal results are displayed) Labs Reviewed  NOVEL CORONAVIRUS, NAA  POCT INFLUENZA A/B    EKG   Radiology No results found.  Procedures Procedures (including critical care time)  Medications Ordered in UC Medications - No data to display  Initial Impression / Assessment and Plan / UC Course  I have reviewed the triage vital signs and the nursing notes.  Pertinent labs & imaging results that were available during my care of the patient were reviewed by me and considered in my medical decision making (see chart for details).   Point-of-care influenza test was ordered and result was reviewed.  Result is negative for influenza.  COVID-19 test was ordered.  Patient stable for discharge.  Advised patient to quarantine until COVID-19 test result become available.  To go to ED for worsening symptoms.  Patient verbalized understand the plan of care.   Final Clinical Impressions(s) / UC Diagnoses   Final diagnoses:  Suspected COVID-19 virus infection     Discharge Instructions     Point-of-care influenza test is negative COVID testing ordered.  It will take  between 2-7 days for test results.  Someone will contact you regarding abnormal results.    In the meantime: You should remain isolated in your home for 10 days from symptom onset AND greater than 72 hours after symptoms resolution (absence of fever without the use of fever-reducing medication and improvement in  respiratory symptoms), whichever is longer Get plenty of rest and push fluids Zyrtec prescribed for nasal congestion, runny nose, and/or sore throat Flonase prescribed for nasal congestion and runny nose Use medications daily for symptom relief Use OTC medications like ibuprofen or tylenol as needed fever or pain Call or go to the ED if you have any new or worsening symptoms such as fever, worsening cough, shortness of breath, chest tightness, chest pain, turning blue, changes in mental status, etc...     ED Prescriptions    Medication Sig Dispense Auth. Provider   Cetirizine HCl (ZYRTEC ALLERGY) 10 MG CAPS Take 1 capsule (10 mg total) by mouth 1 day or 1 dose for 1 dose. 30 capsule Alira Fretwell S, FNP   fluticasone (FLONASE) 50 MCG/ACT nasal spray Place 1 spray into both nostrils daily for 14 days. 16 g Emerson Monte, FNP     PDMP not reviewed this encounter.   Emerson Monte, FNP 05/03/19 1139    Emerson Monte, FNP 05/03/19 1142

## 2019-05-03 NOTE — Discharge Instructions (Addendum)
Point-of-care influenza test is negative COVID testing ordered.  It will take between 2-7 days for test results.  Someone will contact you regarding abnormal results.    In the meantime: You should remain isolated in your home for 10 days from symptom onset AND greater than 72 hours after symptoms resolution (absence of fever without the use of fever-reducing medication and improvement in respiratory symptoms), whichever is longer Get plenty of rest and push fluids Zyrtec prescribed for nasal congestion, runny nose, and/or sore throat Flonase prescribed for nasal congestion and runny nose Use medications daily for symptom relief Use OTC medications like ibuprofen or tylenol as needed fever or pain Call or go to the ED if you have any new or worsening symptoms such as fever, worsening cough, shortness of breath, chest tightness, chest pain, turning blue, changes in mental status, etc..Marland Kitchen

## 2019-05-05 LAB — NOVEL CORONAVIRUS, NAA: SARS-CoV-2, NAA: NOT DETECTED

## 2019-05-26 ENCOUNTER — Encounter: Payer: Self-pay | Admitting: Family Medicine

## 2019-05-26 ENCOUNTER — Ambulatory Visit (INDEPENDENT_AMBULATORY_CARE_PROVIDER_SITE_OTHER): Payer: Managed Care, Other (non HMO) | Admitting: Family Medicine

## 2019-05-26 ENCOUNTER — Other Ambulatory Visit: Payer: Self-pay

## 2019-05-26 VITALS — Ht 70.0 in | Wt 217.0 lb

## 2019-05-26 DIAGNOSIS — J0100 Acute maxillary sinusitis, unspecified: Secondary | ICD-10-CM | POA: Diagnosis not present

## 2019-05-26 DIAGNOSIS — R208 Other disturbances of skin sensation: Secondary | ICD-10-CM

## 2019-05-26 MED ORDER — AMOXICILLIN 500 MG PO CAPS: 1000.0000 mg | ORAL_CAPSULE | Freq: Two times a day (BID) | ORAL | 0 refills | Status: DC

## 2019-05-26 NOTE — Patient Instructions (Addendum)
Treat with amox x 10 days.  If not improving in 48-72 hours.. consider repeat COVID testing and in persone exam.

## 2019-05-26 NOTE — Progress Notes (Signed)
VIRTUAL VISIT Due to national recommendations of social distancing due to [place] 19, a virtual visit is felt to be most appropriate for this patient at this time.   I connected with the patient on 05/26/19 at  4:00 PM EST by virtual telehealth platform and verified that I am speaking with the correct person using two identifiers.   I discussed the limitations, risks, security and privacy concerns of performing an evaluation and management service by  virtual telehealth platform and the availability of in person appointments. I also discussed with the patient that there may be a patient responsible charge related to this service. The patient expressed understanding and agreed to proceed.  Patient location: Home Provider Location: [facility] Participants: Eliezer Lofts and Leitha Bleak   Chief Complaint  Patient presents with  . sinus congestion    sx started about 3-4 days ago Denies fever,chills  . Nasal Congestion  . Sore Throat  . sinus pressure    History of Present Illness:  34 year old obese male presents with new onset sinus congestion, ST, sinus pressure worsen x 3-4 days.  Prior to symptoms noted scab at fever blister on left nostril.. Progessed to burning pressure in nose. Feels like nasal passages burning.  Swelling in nose.  Blood green nasal discharge for nose.     He denies  Cough, no SOB, no body ache, no fever.    Several weeks ago.. cold... neg flu negative COVID. Improved but continued congestion since.   He has history of history of sinus issue, frequent sinus infeciton allergic rhinitis  Not currently taking zyrtec and flonase/nasocort  COVID 19 screen No recent travel or known exposure to [place] The patient denies respiratory symptoms of COVID 19 at this time.  The importance of social distancing was discussed today.   Review of Systems  Constitutional: Negative for chills and fever.  HENT: Positive for congestion and sinus pain. Negative for ear  pain.   Eyes: Negative for pain and redness.  Respiratory: Negative for cough and shortness of breath.   Cardiovascular: Negative for chest pain, palpitations and leg swelling.  Gastrointestinal: Negative for abdominal pain, blood in stool, constipation, diarrhea, nausea and vomiting.  Genitourinary: Negative for dysuria.  Musculoskeletal: Negative for falls and myalgias.  Skin: Negative for rash.  Neurological: Negative for dizziness.  Psychiatric/Behavioral: Negative for depression. The patient is not nervous/anxious.       Past Medical History:  Diagnosis Date  . Elevated blood pressure reading   . GAD (generalized anxiety disorder)   . PVC (premature ventricular contraction)     reports that he has never smoked. He has never used smokeless tobacco. He reports current alcohol use.   Current Outpatient Medications:  .  Cetirizine HCl (ZYRTEC ALLERGY) 10 MG CAPS, Take 1 capsule (10 mg total) by mouth 1 day or 1 dose for 1 dose., Disp: 30 capsule, Rfl: 0 .  fluticasone (FLONASE) 50 MCG/ACT nasal spray, Place 1 spray into both nostrils daily for 14 days., Disp: 16 g, Rfl: 0 .  triamcinolone (NASACORT) 55 MCG/ACT AERO nasal inhaler, Place 2 sprays into the nose daily., Disp: , Rfl:  .  venlafaxine XR (EFFEXOR-XR) 75 MG 24 hr capsule, TAKE 1 CAPSULE BY MOUTH  DAILY WITH BREAKFAST, Disp: 90 capsule, Rfl: 3   Observations/Objective: Height 5\' 10"  (1.778 m), weight 217 lb (98.4 kg).  Physical Exam  Physical Exam Constitutional:      General: The patient is not in acute distress. Pulmonary:  Effort: Pulmonary effort is normal. No respiratory distress.  Neurological:     Mental Status: The patient is alert and oriented to person, place, and time.  Psychiatric:        Mood and Affect: Mood normal.        Behavior: Behavior normal.   Assessment and Plan Acute non-recurrent maxillary sinusitis  Likely bacterial infection.   Treat with amox x 10 days.  if not improving in 48-72  hours.. consider repeat COVID testing and in persone exam.     I discussed the assessment and treatment plan with the patient. The patient was provided an opportunity to ask questions and all were answered. The patient agreed with the plan and demonstrated an understanding of the instructions.   The patient was advised to call back or seek an in-person evaluation if the symptoms worsen or if the condition fails to improve as anticipated.     Kerby Nora, MD

## 2019-05-30 ENCOUNTER — Ambulatory Visit
Admission: EM | Admit: 2019-05-30 | Discharge: 2019-05-30 | Disposition: A | Discharge status: Home / Self Care | Payer: Managed Care, Other (non HMO) | Attending: Emergency Medicine | Admitting: Emergency Medicine

## 2019-05-30 ENCOUNTER — Other Ambulatory Visit: Payer: Self-pay

## 2019-05-30 DIAGNOSIS — Z20822 Contact with and (suspected) exposure to covid-19: Secondary | ICD-10-CM

## 2019-05-30 DIAGNOSIS — Z1152 Encounter for screening for COVID-19: Secondary | ICD-10-CM

## 2019-05-30 MED ORDER — CETIRIZINE-PSEUDOEPHEDRINE ER 5-120 MG PO TB12: 1.0000 | ORAL_TABLET | Freq: Two times a day (BID) | ORAL | 0 refills | Status: DC

## 2019-05-30 MED ORDER — FLUTICASONE PROPIONATE 50 MCG/ACT NA SUSP: 1.0000 | Freq: Every day | NASAL | 0 refills | Status: DC

## 2019-05-30 NOTE — ED Triage Notes (Signed)
Pt here with nasal congestion that developed a week ago, pt states he had video visit and has been taking amoxicillin with no relief in symptoms

## 2019-05-30 NOTE — Discharge Instructions (Addendum)
COVID testing ordered.  It will take between 2-7 days for test results.  Someone will contact you regarding abnormal results.    In the meantime: You should remain isolated in your home for 10 days from symptom onset AND greater than 72 hours after symptoms resolution (absence of fever without the use of fever-reducing medication and improvement in respiratory symptoms), whichever is longer Get plenty of rest and push fluids Zyrtec-D prescribed for nasal congestion, runny nose, and/or sore throat Flonase prescribed for nasal congestion and runny nose Use medications daily for symptom relief Use OTC medications like ibuprofen or tylenol as needed fever or pain Call or go to the ED if you have any new or worsening symptoms such as fever, worsening cough, shortness of breath, chest tightness, chest pain, turning blue, changes in mental status, etc..Marland Kitchen

## 2019-05-30 NOTE — ED Provider Notes (Signed)
[facility]    CSN: [id number] Arrival date & time: 05/30/19  1131      History   Chief Complaint Chief Complaint  Patient presents with  . Nasal Congestion    HPI Nathaniel Frank is a 34 y.o. male.   who presents to the urgent care with a complaint of nasal congestion for the past 7 days.  Denies sick exposure to COVID, flu or strep.  Denies recent travel.  Denies aggravating or alleviating symptoms.  Denies previous COVID infection.   Denies fever, chills, fatigue,  rhinorrhea, sore throat, cough, SOB, wheezing, chest pain, nausea, vomiting, changes in bowel or bladder habits.    The history is provided by the patient. No language interpreter was used.    Past Medical History:  Diagnosis Date  . Elevated blood pressure reading   . GAD (generalized anxiety disorder)   . PVC (premature ventricular contraction)     Patient Active Problem List   Diagnosis Date Noted  . Chronic gout 01/09/2017  . GAD (generalized anxiety disorder) 10/14/2016  . Preventative health care 10/14/2016  . Allergic rhinitis 01/04/2016  . Elevated blood pressure reading 01/04/2016    Past Surgical History:  Procedure Laterality Date  . TONSILLECTOMY AND ADENOIDECTOMY  1999       Home Medications    Prior to Admission medications   Medication Sig Start Date End Date Taking? Authorizing Provider  amoxicillin (AMOXIL) 500 MG capsule Take 2 capsules (1,000 mg total) by mouth 2 (two) times daily. 05/26/19   Bedsole, Amy E, MD  Cetirizine HCl (ZYRTEC ALLERGY) 10 MG CAPS Take 1 capsule (10 mg total) by mouth 1 day or 1 dose for 1 dose. 05/03/19 05/04/19  Tanishka Drolet, Zachery Dakins, FNP  cetirizine-pseudoephedrine (ZYRTEC-D) 5-120 MG tablet Take 1 tablet by mouth 2 (two) times daily. 05/30/19   Raevon Broom, Zachery Dakins, FNP  fluticasone (FLONASE) 50 MCG/ACT nasal spray Place 1 spray into both nostrils daily for 14 days. 05/30/19 06/13/19  Laurana Magistro, Zachery Dakins, FNP  triamcinolone (NASACORT) 55 MCG/ACT AERO nasal  inhaler Place 2 sprays into the nose daily.    [provider]  venlafaxine XR (EFFEXOR-XR) 75 MG 24 hr capsule TAKE 1 CAPSULE BY MOUTH  DAILY WITH BREAKFAST 12/06/18   Doreene Nest, NP    Family History Family History  Problem Relation Age of Onset  . Renal cancer Father     Social History Social History   Tobacco Use  . Smoking status: Never Smoker  . Smokeless tobacco: Never Used  Substance Use Topics  . Alcohol use: Yes    Comment: soical  . Drug use: Not on file     Allergies   Patient has no known allergies.   Review of Systems Review of Systems  Constitutional: Negative.   HENT: Positive for congestion.   Respiratory: Negative.   Cardiovascular: Negative.   Gastrointestinal: Negative.   Neurological: Negative.   All other systems reviewed and are negative.    Physical Exam Triage Vital Signs ED Triage Vitals  Enc Vitals Group     BP 05/30/19 1151 (!) 159/108     Pulse Rate 05/30/19 1151 91     Resp 05/30/19 1151 18     Temp 05/30/19 1151 98.8 F (37.1 C)     Temp src --      SpO2 05/30/19 1151 97 %     Weight --      Height --      Head Circumference --  Peak Flow --      Pain Score 05/30/19 1149 6     Pain Loc --      Pain Edu? --      Excl. in GC? --    No data found.  Updated Vital Signs BP (!) 159/108   Pulse 91   Temp 98.8 F (37.1 C)   Resp 18   SpO2 97%   Visual Acuity Right Eye Distance:   Left Eye Distance:   Bilateral Distance:    Right Eye Near:   Left Eye Near:    Bilateral Near:     Physical Exam Vitals and nursing note reviewed.  Constitutional:      General: He is not in acute distress.    Appearance: Normal appearance. He is normal weight. He is not ill-appearing or toxic-appearing.  HENT:     Head: Normocephalic.     Right Ear: Tympanic membrane, ear canal and external ear normal. There is no impacted cerumen.     Left Ear: Tympanic membrane, ear canal and external ear normal. There is no  impacted cerumen.     Nose: Nose normal. No congestion.     Mouth/Throat:     Mouth: Mucous membranes are moist.     Pharynx: Oropharynx is clear. No oropharyngeal exudate or posterior oropharyngeal erythema.  Cardiovascular:     Rate and Rhythm: Normal rate and regular rhythm.     Pulses: Normal pulses.     Heart sounds: Normal heart sounds. No murmur.  Pulmonary:     Effort: Pulmonary effort is normal. No respiratory distress.     Breath sounds: Normal breath sounds. No wheezing or rhonchi.  Chest:     Chest wall: No tenderness.  Abdominal:     General: Abdomen is flat. Bowel sounds are normal. There is no distension.     Palpations: There is no mass.     Tenderness: There is no abdominal tenderness.  Skin:    Capillary Refill: Capillary refill takes less than 2 seconds.  Neurological:     General: No focal deficit present.     Mental Status: He is alert and oriented to person, place, and time.      UC Treatments / Results  Labs (all labs ordered are listed, but only abnormal results are displayed) Labs Reviewed  NOVEL CORONAVIRUS, NAA    EKG   Radiology No results found.  Procedures Procedures (including critical care time)  Medications Ordered in UC Medications - No data to display  Initial Impression / Assessment and Plan / UC Course  I have reviewed the triage vital signs and the nursing notes.  Pertinent labs & imaging results that were available during my care of the patient were reviewed by me and considered in my medical decision making (see chart for details).    COVID-19 test was ordered. Advised patient to quarantine Zyrtec-D was ordered Flonase was ordered To go to ED for worsening symptoms  Final Clinical Impressions(s) / UC Diagnoses   Final diagnoses:  Suspected COVID-19 virus infection     Discharge Instructions     COVID testing ordered.  It will take between 5-7 days for test results.  Someone will contact you regarding abnormal  results.    In the meantime: You should remain isolated in your home for 10 days from symptom onset AND greater than 72 hours after symptoms resolution (absence of fever without the use of fever-reducing medication and improvement in respiratory symptoms), whichever is longer Get plenty  of rest and push fluids Zyrtec-D prescribed for nasal congestion, runny nose, and/or sore throat Flonase prescribed for nasal congestion and runny nose Use medications daily for symptom relief Use OTC medications like ibuprofen or tylenol as needed fever or pain Call or go to the ED if you have any new or worsening symptoms such as fever, worsening cough, shortness of breath, chest tightness, chest pain, turning blue, changes in mental status, etc...     ED Prescriptions    Medication Sig Dispense Auth. Provider   cetirizine-pseudoephedrine (ZYRTEC-D) 5-120 MG tablet Take 1 tablet by mouth 2 (two) times daily. 30 tablet Jonavon Trieu S, FNP   fluticasone (FLONASE) 50 MCG/ACT nasal spray Place 1 spray into both nostrils daily for 14 days. 16 g Emerson Monte, FNP     PDMP not reviewed this encounter.   Emerson Monte, FNP 05/30/19 1212

## 2019-05-31 LAB — NOVEL CORONAVIRUS, NAA: SARS-CoV-2, NAA: NOT DETECTED

## 2019-06-22 DIAGNOSIS — J0100 Acute maxillary sinusitis, unspecified: Secondary | ICD-10-CM | POA: Insufficient documentation

## 2019-06-22 HISTORY — DX: Acute maxillary sinusitis, unspecified: J01.00

## 2019-06-22 NOTE — Assessment & Plan Note (Signed)
Likely bacterial infection.   Treat with amox x 10 days.  if not improving in 48-72 hours.. consider repeat COVID testing and in persone exam.

## 2019-11-04 ENCOUNTER — Other Ambulatory Visit: Payer: Self-pay | Admitting: Primary Care

## 2019-11-04 DIAGNOSIS — F411 Generalized anxiety disorder: Secondary | ICD-10-CM

## 2019-12-15 ENCOUNTER — Encounter: Payer: Managed Care, Other (non HMO) | Admitting: Primary Care

## 2020-01-23 ENCOUNTER — Other Ambulatory Visit: Payer: Self-pay | Admitting: Primary Care

## 2020-01-23 DIAGNOSIS — Z0000 Encounter for general adult medical examination without abnormal findings: Secondary | ICD-10-CM

## 2020-01-23 DIAGNOSIS — Z114 Encounter for screening for human immunodeficiency virus [HIV]: Secondary | ICD-10-CM

## 2020-01-23 DIAGNOSIS — M1A9XX0 Chronic gout, unspecified, without tophus (tophi): Secondary | ICD-10-CM

## 2020-01-23 DIAGNOSIS — Z1159 Encounter for screening for other viral diseases: Secondary | ICD-10-CM

## 2020-01-28 ENCOUNTER — Other Ambulatory Visit: Payer: Self-pay | Admitting: Primary Care

## 2020-01-28 DIAGNOSIS — F411 Generalized anxiety disorder: Secondary | ICD-10-CM

## 2020-02-03 ENCOUNTER — Other Ambulatory Visit (INDEPENDENT_AMBULATORY_CARE_PROVIDER_SITE_OTHER): Payer: Managed Care, Other (non HMO)

## 2020-02-03 ENCOUNTER — Other Ambulatory Visit: Payer: Self-pay

## 2020-02-03 DIAGNOSIS — Z0000 Encounter for general adult medical examination without abnormal findings: Secondary | ICD-10-CM

## 2020-02-03 DIAGNOSIS — M1A9XX0 Chronic gout, unspecified, without tophus (tophi): Secondary | ICD-10-CM

## 2020-02-03 DIAGNOSIS — Z1159 Encounter for screening for other viral diseases: Secondary | ICD-10-CM

## 2020-02-03 DIAGNOSIS — Z114 Encounter for screening for human immunodeficiency virus [HIV]: Secondary | ICD-10-CM

## 2020-02-04 LAB — COMPREHENSIVE METABOLIC PANEL
ALT: 23 IU/L (ref 0–44)
AST: 16 IU/L (ref 0–40)
Albumin/Globulin Ratio: 2.2 (ref 1.2–2.2)
Albumin: 5 g/dL (ref 4.0–5.0)
Alkaline Phosphatase: 46 IU/L (ref 44–121)
BUN/Creatinine Ratio: 14 (ref 9–20)
BUN: 13 mg/dL (ref 6–20)
Bilirubin Total: 0.4 mg/dL (ref 0.0–1.2)
CO2: 25 mmol/L (ref 20–29)
Calcium: 9.7 mg/dL (ref 8.7–10.2)
Chloride: 103 mmol/L (ref 96–106)
Creatinine, Ser: 0.92 mg/dL (ref 0.76–1.27)
GFR calc Af Amer: 123 mL/min/{1.73_m2} (ref >59)
GFR calc non Af Amer: 107 mL/min/{1.73_m2} (ref >59)
Globulin, Total: 2.3 g/dL (ref 1.5–4.5)
Glucose: 132 mg/dL — ABNORMAL HIGH (ref 65–99)
Potassium: 4.7 mmol/L (ref 3.5–5.2)
Sodium: 140 mmol/L (ref 134–144)
Total Protein: 7.3 g/dL (ref 6.0–8.5)

## 2020-02-04 LAB — LIPID PANEL
Chol/HDL Ratio: 6.9 ratio — ABNORMAL HIGH (ref 0.0–5.0)
Cholesterol, Total: 288 mg/dL — ABNORMAL HIGH (ref 100–199)
HDL: 42 mg/dL (ref >39)
LDL Chol Calc (NIH): 176 mg/dL — ABNORMAL HIGH (ref 0–99)
Triglycerides: 359 mg/dL — ABNORMAL HIGH (ref 0–149)
VLDL Cholesterol Cal: 70 mg/dL — ABNORMAL HIGH (ref 5–40)

## 2020-02-04 LAB — HEPATITIS C ANTIBODY: Hep C Virus Ab: <0.1 s/co ratio (ref 0.0–0.9)

## 2020-02-04 LAB — CBC
Hematocrit: 41.4 % (ref 37.5–51.0)
Hemoglobin: 14.2 g/dL (ref 13.0–17.7)
MCH: 31.2 pg (ref 26.6–33.0)
MCHC: 34.3 g/dL (ref 31.5–35.7)
MCV: 91 fL (ref 79–97)
Platelets: 175 10*3/uL (ref 150–450)
RBC: 4.55 x10E6/uL (ref 4.14–5.80)
RDW: 12.9 % (ref 11.6–15.4)
WBC: 7.2 10*3/uL (ref 3.4–10.8)

## 2020-02-04 LAB — URIC ACID: Uric Acid: 9.5 mg/dL — ABNORMAL HIGH (ref 3.8–8.4)

## 2020-02-04 LAB — HIV ANTIBODY (ROUTINE TESTING W REFLEX): HIV Screen 4th Generation wRfx: NONREACTIVE

## 2020-02-10 ENCOUNTER — Other Ambulatory Visit: Payer: Self-pay

## 2020-02-10 ENCOUNTER — Ambulatory Visit (INDEPENDENT_AMBULATORY_CARE_PROVIDER_SITE_OTHER): Payer: Managed Care, Other (non HMO) | Admitting: Primary Care

## 2020-02-10 VITALS — BP 124/68 | HR 95 | Temp 97.6°F | Ht 70.0 in | Wt 227.0 lb

## 2020-02-10 DIAGNOSIS — M1A9XX0 Chronic gout, unspecified, without tophus (tophi): Secondary | ICD-10-CM

## 2020-02-10 DIAGNOSIS — R739 Hyperglycemia, unspecified: Secondary | ICD-10-CM | POA: Diagnosis not present

## 2020-02-10 DIAGNOSIS — Z0000 Encounter for general adult medical examination without abnormal findings: Secondary | ICD-10-CM | POA: Diagnosis not present

## 2020-02-10 DIAGNOSIS — F411 Generalized anxiety disorder: Secondary | ICD-10-CM

## 2020-02-10 DIAGNOSIS — R7303 Prediabetes: Secondary | ICD-10-CM | POA: Insufficient documentation

## 2020-02-10 DIAGNOSIS — Z23 Encounter for immunization: Secondary | ICD-10-CM | POA: Diagnosis not present

## 2020-02-10 MED ORDER — HYDROXYZINE HCL 10 MG PO TABS: 10.0000 mg | ORAL_TABLET | Freq: Two times a day (BID) | ORAL | 0 refills | Status: DC | PRN

## 2020-02-10 MED ORDER — VENLAFAXINE HCL ER 75 MG PO CP24: 75.0000 mg | ORAL_CAPSULE | Freq: Every day | ORAL | 3 refills | Status: DC

## 2020-02-10 MED ORDER — VENLAFAXINE HCL ER 37.5 MG PO CP24: 37.5000 mg | ORAL_CAPSULE | Freq: Every day | ORAL | 3 refills | Status: DC

## 2020-02-10 NOTE — Assessment & Plan Note (Signed)
Glucose reading of 132, non fasting. Checking A1C today to be sure.

## 2020-02-10 NOTE — Assessment & Plan Note (Signed)
It appears that his venlafaxine dose of 75 mg may be wearing off to soon. Will trial a dose increase by adding 37.5 mg daily.   Rx provided for hydroxyzine sent to pharmacy to use PRN, drowsiness precautions provided.   He will update via my chart in one month.

## 2020-02-10 NOTE — Assessment & Plan Note (Signed)
Asymptomatic for years, recent uric acid level of 9.5. He admits to increased intake of beer recently. Discussed triggers for gout and increases in uric acid level.  Repeat uric acid level pending.

## 2020-02-10 NOTE — Progress Notes (Signed)
Subjective:    Patient ID: Nathaniel Frank, male    DOB: [birth year], 34 y.o.   MRN: [id number]  HPI  This visit occurred during the SARS-CoV-2 public health emergency.  Safety protocols were in place, including screening questions prior to the visit, additional usage of staff PPE, and extensive cleaning of exam room while observing appropriate contact time as indicated for disinfecting solutions.   Nathaniel Frank is a 34 year old male who presents today for complete physical.  Overall doing well on venlafaxine ER 75 mg, but still has moments of anxiety, irrational thinking, dwelling on things. He's also experiencing a sensation of withdrawal at the 24 hour mark, feels like the medication is wearing off.   Immunizations: -Tetanus: Completed in 2019 -Influenza: Due today -Covid-19: Completed in April   Diet: She endorses a fair diet. More night time snacking Exercise: Active at home.   Eye exam: No recent exam Dental exam: No recent exam  BP Readings from Last 3 Encounters:  02/10/20 124/68  05/30/19 (!) 159/108  05/03/19 (!) 163/110       Review of Systems  Constitutional: Negative for unexpected weight change.  HENT: Negative for rhinorrhea.   Eyes: Negative for visual disturbance.  Respiratory: Negative for cough and shortness of breath.   Cardiovascular: Negative for chest pain.  Gastrointestinal: Negative for constipation and diarrhea.  Genitourinary: Negative for difficulty urinating.  Musculoskeletal: Negative for arthralgias and myalgias.  Skin: Negative for rash.  Allergic/Immunologic: Negative for environmental allergies.  Neurological: Negative for dizziness, numbness and headaches.  Psychiatric/Behavioral: The patient is nervous/anxious.        Past Medical History:  Diagnosis Date  . Elevated blood pressure reading   . GAD (generalized anxiety disorder)   . PVC (premature ventricular contraction)      Social History   Socioeconomic History  . Marital  status: Legally Separated    Spouse name: Not on file  . Number of children: Not on file  . Years of education: Not on file  . Highest education level: Not on file  Occupational History  . Not on file  Tobacco Use  . Smoking status: Never Smoker  . Smokeless tobacco: Never Used  Substance and Sexual Activity  . Alcohol use: Yes    Comment: soical  . Drug use: Not on file  . Sexual activity: Not on file  Other Topics Concern  . Not on file  Social History Narrative   Engaged.   3 children.   Works for [employer].   Enjoys golfing.    Social Determinants of Health   Financial Resource Strain:   . Difficulty of Paying Living Expenses: Not on file  Food Insecurity:   . Worried About Programme researcher, broadcasting/film/video in the Last Year: Not on file  . Ran Out of Food in the Last Year: Not on file  Transportation Needs:   . Lack of Transportation (Medical): Not on file  . Lack of Transportation (Non-Medical): Not on file  Physical Activity:   . Days of Exercise per Week: Not on file  . Minutes of Exercise per Session: Not on file  Stress:   . Feeling of Stress : Not on file  Social Connections:   . Frequency of Communication with Friends and Family: Not on file  . Frequency of Social Gatherings with Friends and Family: Not on file  . Attends Religious Services: Not on file  . Active Member of Clubs or Organizations: Not on file  . Attends  Club or Organization Meetings: Not on file  . Marital Status: Not on file  Intimate Partner Violence:   . Fear of Current or Ex-Partner: Not on file  . Emotionally Abused: Not on file  . Physically Abused: Not on file  . Sexually Abused: Not on file    Past Surgical History:  Procedure Laterality Date  . TONSILLECTOMY AND ADENOIDECTOMY  1999    Family History  Problem Relation Age of Onset  . Renal cancer Father     No Known Allergies  Current Outpatient Medications on File Prior to Visit  Medication Sig Dispense Refill  . triamcinolone  (NASACORT) 55 MCG/ACT AERO nasal inhaler Place 2 sprays into the nose daily.    Marland Kitchen venlafaxine XR (EFFEXOR-XR) 75 MG 24 hr capsule TAKE 1 CAPSULE BY MOUTH  DAILY WITH BREAKFAST 30 capsule 0   No current facility-administered medications on file prior to visit.    BP 124/68   Pulse 95   Temp 97.6 F (36.4 C) (Temporal)   Ht 5\' 10"  (1.778 m)   Wt 227 lb (103 kg)   SpO2 98%   BMI 32.57 kg/m    Objective:   Physical Exam HENT:     Right Ear: Tympanic membrane and ear canal normal.     Left Ear: Tympanic membrane and ear canal normal.  Eyes:     Pupils: Pupils are equal, round, and reactive to light.  Cardiovascular:     Rate and Rhythm: Normal rate and regular rhythm.  Pulmonary:     Effort: Pulmonary effort is normal.     Breath sounds: Normal breath sounds.  Abdominal:     General: Bowel sounds are normal.     Palpations: Abdomen is soft.     Tenderness: There is no abdominal tenderness.  Musculoskeletal:        General: Normal range of motion.     Cervical back: Neck supple.  Skin:    General: Skin is warm and dry.  Neurological:     Mental Status: He is alert and oriented to person, place, and time.     Cranial Nerves: No cranial nerve deficit.     Deep Tendon Reflexes:     Reflex Scores:      Patellar reflexes are 2+ on the right side and 2+ on the left side. Psychiatric:        Mood and Affect: Mood normal.            Assessment & Plan:

## 2020-02-10 NOTE — Addendum Note (Signed)
Addended by: Aquilla Solian on: 02/10/2020 03:17 PM   Modules accepted: Orders

## 2020-02-10 NOTE — Patient Instructions (Addendum)
Start venlafaxine ER 37.5 mg once daily for anxiety. Take along with 75 mg dose.  You may take the hydroxyzine medication twice daily as needed for acute anxiety symptoms. This may cause drowsiness.   Please update me in 1 month.  Start exercising. You should be getting 150 minutes of moderate intensity exercise weekly.  It's important to improve your diet by reducing consumption of fast food, fried food, processed snack foods, sugary drinks. Increase consumption of fresh vegetables and fruits, whole grains, water.  Ensure you are drinking 64 ounces of water daily.  Schedule a lab appointment for 6 months to repeat cholesterol.  It was a pleasure to see you today!   Preventive Care 62-44 Years Old, Male Preventive care refers to lifestyle choices and visits with your health care provider that can promote health and wellness. This includes:  A yearly physical exam. This is also called an annual well check.  Regular dental and eye exams.  Immunizations.  Screening for certain conditions.  Healthy lifestyle choices, such as eating a healthy diet, getting regular exercise, not using drugs or products that contain nicotine and tobacco, and limiting alcohol use. What can I expect for my preventive care visit? Physical exam Your health care provider will check:  Height and weight. These may be used to calculate body mass index (BMI), which is a measurement that tells if you are at a healthy weight.  Heart rate and blood pressure.  Your skin for abnormal spots. Counseling Your health care provider may ask you questions about:  Alcohol, tobacco, and drug use.  Emotional well-being.  Home and relationship well-being.  Sexual activity.  Eating habits.  Work and work Statistician. What immunizations do I need?  Influenza (flu) vaccine  This is recommended every year. Tetanus, diphtheria, and pertussis (Tdap) vaccine  You may need a Td booster every 10 years. Varicella  (chickenpox) vaccine  You may need this vaccine if you have not already been vaccinated. Human papillomavirus (HPV) vaccine  If recommended by your health care provider, you may need three doses over 6 months. Measles, mumps, and rubella (MMR) vaccine  You may need at least one dose of MMR. You may also need a second dose. Meningococcal conjugate (MenACWY) vaccine  One dose is recommended if you are 41-36 years old and a Market researcher living in a residence hall, or if you have one of several medical conditions. You may also need additional booster doses. Pneumococcal conjugate (PCV13) vaccine  You may need this if you have certain conditions and were not previously vaccinated. Pneumococcal polysaccharide (PPSV23) vaccine  You may need one or two doses if you smoke cigarettes or if you have certain conditions. Hepatitis A vaccine  You may need this if you have certain conditions or if you travel or work in places where you may be exposed to hepatitis A. Hepatitis B vaccine  You may need this if you have certain conditions or if you travel or work in places where you may be exposed to hepatitis B. Haemophilus influenzae type b (Hib) vaccine  You may need this if you have certain risk factors. You may receive vaccines as individual doses or as more than one vaccine together in one shot (combination vaccines). Talk with your health care provider about the risks and benefits of combination vaccines. What tests do I need? Blood tests  Lipid and cholesterol levels. These may be checked every 5 years starting at age 66.  Hepatitis C test.  Hepatitis B  test. Screening   Diabetes screening. This is done by checking your blood sugar (glucose) after you have not eaten for a while (fasting).  Sexually transmitted disease (STD) testing. Talk with your health care provider about your test results, treatment options, and if necessary, the need for more tests. Follow these  instructions at home: Eating and drinking   Eat a diet that includes fresh fruits and vegetables, whole grains, lean protein, and low-fat dairy products.  Take vitamin and mineral supplements as recommended by your health care provider.  Do not drink alcohol if your health care provider tells you not to drink.  If you drink alcohol: ? Limit how much you have to 0-2 drinks a day. ? Be aware of how much alcohol is in your drink. In the [place] one drink equals one 12 oz bottle of beer (355 mL), one 5 oz glass of wine (148 mL), or one 1 oz glass of hard liquor (44 mL). Lifestyle  Take daily care of your teeth and gums.  Stay active. Exercise for at least 30 minutes on 5 or more days each week.  Do not use any products that contain nicotine or tobacco, such as cigarettes, e-cigarettes, and chewing tobacco. If you need help quitting, ask your health care provider.  If you are sexually active, practice safe sex. Use a condom or other form of protection to prevent STIs (sexually transmitted infections). What's next?  Go to your health care provider once a year for a well check visit.  Ask your health care provider how often you should have your eyes and teeth checked.  Stay up to date on all vaccines. This information is not intended to replace advice given to you by your health care provider. Make sure you discuss any questions you have with your health care provider. Document Revised: 04/01/2018 Document Reviewed: 04/01/2018 Elsevier Patient Education  [place].   Influenza (Flu) Vaccine (Inactivated or Recombinant): What You Need to Know 1. Why get vaccinated? Influenza vaccine can prevent influenza (flu). Flu is a contagious disease that spreads around the [place] every year, usually between October and May. Anyone can get the flu, but it is more dangerous for some people. Infants and young children, people 23 years of age and older, pregnant women, and people with  certain health conditions or a weakened immune system are at greatest risk of flu complications. Pneumonia, bronchitis, sinus infections and ear infections are examples of flu-related complications. If you have a medical condition, such as heart disease, cancer or diabetes, flu can make it worse. Flu can cause fever and chills, sore throat, muscle aches, fatigue, cough, headache, and runny or stuffy nose. Some people may have vomiting and diarrhea, though this is more common in children than adults. Each year thousands of people in the [place] die from flu, and many more are hospitalized. Flu vaccine prevents millions of illnesses and flu-related visits to the doctor each year. 2. Influenza vaccine CDC recommends everyone 61 months of age and older get vaccinated every flu season. Children 6 months through 60 years of age may need 2 doses during a single flu season. Everyone else needs only 1 dose each flu season. It takes about 2 weeks for protection to develop after vaccination. There are many flu viruses, and they are always changing. Each year a new flu vaccine is made to protect against three or four viruses that are likely to cause disease in the upcoming flu season. Even when the vaccine  doesn't exactly match these viruses, it may still provide some protection. Influenza vaccine does not cause flu. Influenza vaccine may be given at the same time as other vaccines. 3. Talk with your health care provider Tell your vaccine provider if the person getting the vaccine:  Has had an allergic reaction after a previous dose of influenza vaccine, or has any severe, life-threatening allergies.  Has ever had Guillain-Barr Syndrome (also called GBS). In some cases, your health care provider may decide to postpone influenza vaccination to a future visit. People with minor illnesses, such as a cold, may be vaccinated. People who are moderately or severely ill should usually wait until they recover  before getting influenza vaccine. Your health care provider can give you more information. 4. Risks of a vaccine reaction  Soreness, redness, and swelling where shot is given, fever, muscle aches, and headache can happen after influenza vaccine.  There may be a very small increased risk of Guillain-Barr Syndrome (GBS) after inactivated influenza vaccine (the flu shot). Young children who get the flu shot along with pneumococcal vaccine (PCV13), and/or DTaP vaccine at the same time might be slightly more likely to have a seizure caused by fever. Tell your health care provider if a child who is getting flu vaccine has ever had a seizure. People sometimes faint after medical procedures, including vaccination. Tell your provider if you feel dizzy or have vision changes or ringing in the ears. As with any medicine, there is a very remote chance of a vaccine causing a severe allergic reaction, other serious injury, or death. 5. What if there is a serious problem? An allergic reaction could occur after the vaccinated person leaves the clinic. If you see signs of a severe allergic reaction (hives, swelling of the face and throat, difficulty breathing, a fast heartbeat, dizziness, or weakness), call 9-1-1 and get the person to the nearest hospital. For other signs that concern you, call your health care provider. Adverse reactions should be reported to the Vaccine Adverse Event Reporting System (VAERS). Your health care provider will usually file this report, or you can do it yourself. Visit the VAERS website at www.vaers.SamedayNews.es or call (226)710-3684.VAERS is only for reporting reactions, and VAERS staff do not give medical advice. 6. The National Vaccine Injury Compensation Program The [employer] Vaccine Injury Compensation Program (VICP) is a federal program that was created to compensate people who may have been injured by certain vaccines. Visit the VICP website at GoldCloset.com.ee or call  403-773-3701 to learn about the program and about filing a claim. There is a time limit to file a claim for compensation. 7. How can I learn more?  Ask your healthcare provider.  Call your local or state health department.  Contact the Centers for Disease Control and Prevention (CDC): ? Call 731-854-0146 (1-800-CDC-INFO) or ? Visit CDC's https://gibson.com/ Vaccine Information Statement (Interim) Inactivated Influenza Vaccine (12/03/2017) This information is not intended to replace advice given to you by your health care provider. Make sure you discuss any questions you have with your health care provider. Document Revised: 07/27/2018 Document Reviewed: 12/07/2017 Elsevier Patient Education  [place].

## 2020-02-10 NOTE — Assessment & Plan Note (Signed)
Influenza vaccination provided today.  Discussed the importance of a healthy diet and regular exercise in order for weight loss, and to reduce the risk of any potential medical problems.  Exam today unremarkable. Labs reviewed and also pending.

## 2020-02-11 LAB — HEMOGLOBIN A1C
Est. average glucose Bld gHb Est-mCnc: 120 mg/dL
Hgb A1c MFr Bld: 5.8 % — ABNORMAL HIGH (ref 4.8–5.6)

## 2020-02-11 LAB — URIC ACID: Uric Acid: 10.2 mg/dL — ABNORMAL HIGH (ref 3.8–8.4)

## 2020-02-13 ENCOUNTER — Other Ambulatory Visit: Payer: Self-pay | Admitting: Primary Care

## 2020-02-13 DIAGNOSIS — F411 Generalized anxiety disorder: Secondary | ICD-10-CM

## 2020-02-17 ENCOUNTER — Ambulatory Visit
Admission: EM | Admit: 2020-02-17 | Discharge: 2020-02-17 | Disposition: A | Discharge status: Home / Self Care | Payer: Managed Care, Other (non HMO) | Attending: Emergency Medicine | Admitting: Emergency Medicine

## 2020-02-17 ENCOUNTER — Other Ambulatory Visit: Payer: Self-pay

## 2020-02-17 ENCOUNTER — Encounter: Payer: Self-pay | Admitting: Emergency Medicine

## 2020-02-17 DIAGNOSIS — R6889 Other general symptoms and signs: Secondary | ICD-10-CM

## 2020-02-17 DIAGNOSIS — R112 Nausea with vomiting, unspecified: Secondary | ICD-10-CM | POA: Diagnosis not present

## 2020-02-17 MED ORDER — ONDANSETRON HCL 4 MG PO TABS: 4.0000 mg | ORAL_TABLET | Freq: Four times a day (QID) | ORAL | 0 refills | Status: DC

## 2020-02-17 NOTE — ED Provider Notes (Signed)
[facility]   [id number] 02/17/20 Arrival Time: 1134   CC: COVID symptoms  SUBJECTIVE: History from: patient.  Nathaniel Frank is a 34 y.o. male who presents with N/V/D, fatigue, headache, and runny nose x 1 week.  Denies sick exposure to COVID, flu or strep.  Denies alleviating or aggravating factors.  Denies previous COVID infection.  Received covid vaccines.   Denies fever, chills, sore throat, SOB, wheezing, chest pain, changes in bowel or bladder habits.    ROS: As per HPI.  All other pertinent ROS negative.     Past Medical History:  Diagnosis Date  . Elevated blood pressure reading   . GAD (generalized anxiety disorder)   . PVC (premature ventricular contraction)    Past Surgical History:  Procedure Laterality Date  . TONSILLECTOMY AND ADENOIDECTOMY  1999   No Known Allergies No current facility-administered medications on file prior to encounter.   Current Outpatient Medications on File Prior to Encounter  Medication Sig Dispense Refill  . hydrOXYzine (ATARAX/VISTARIL) 10 MG tablet Take 1 tablet (10 mg total) by mouth 2 (two) times daily as needed for anxiety. 30 tablet 0  . triamcinolone (NASACORT) 55 MCG/ACT AERO nasal inhaler Place 2 sprays into the nose daily.    Marland Kitchen venlafaxine XR (EFFEXOR XR) 37.5 MG 24 hr capsule Take 1 capsule (37.5 mg total) by mouth daily with breakfast. For anxiety. Take with 75 mg dose. 90 capsule 3  . venlafaxine XR (EFFEXOR-XR) 75 MG 24 hr capsule Take 1 capsule (75 mg total) by mouth daily with breakfast. For anxiety. 90 capsule 3   Social History   Socioeconomic History  . Marital status: Legally Separated    Spouse name: Not on file  . Number of children: Not on file  . Years of education: Not on file  . Highest education level: Not on file  Occupational History  . Not on file  Tobacco Use  . Smoking status: Never Smoker  . Smokeless tobacco: Never Used  Substance and Sexual Activity  . Alcohol use: Yes    Comment:  soical  . Drug use: Not on file  . Sexual activity: Not on file  Other Topics Concern  . Not on file  Social History Narrative   Engaged.   3 children.   Works for [employer].   Enjoys golfing.    Social Determinants of Health   Financial Resource Strain:   . Difficulty of Paying Living Expenses: Not on file  Food Insecurity:   . Worried About Programme researcher, broadcasting/film/video in the Last Year: Not on file  . Ran Out of Food in the Last Year: Not on file  Transportation Needs:   . Lack of Transportation (Medical): Not on file  . Lack of Transportation (Non-Medical): Not on file  Physical Activity:   . Days of Exercise per Week: Not on file  . Minutes of Exercise per Session: Not on file  Stress:   . Feeling of Stress : Not on file  Social Connections:   . Frequency of Communication with Friends and Family: Not on file  . Frequency of Social Gatherings with Friends and Family: Not on file  . Attends Religious Services: Not on file  . Active Member of Clubs or Organizations: Not on file  . Attends Banker Meetings: Not on file  . Marital Status: Not on file  Intimate Partner Violence:   . Fear of Current or Ex-Partner: Not on file  . Emotionally Abused: Not on  file  . Physically Abused: Not on file  . Sexually Abused: Not on file   Family History  Problem Relation Age of Onset  . Renal cancer Father     OBJECTIVE:  Vitals:   02/17/20 1325 02/17/20 1350  BP: (!) 159/81   Pulse: 71   Resp: 16   Temp: 98.5 F (36.9 C)   TempSrc: Oral   SpO2: 97%   Weight:  228 lb (103.4 kg)  Height:  5\' 11"  (1.803 m)     General appearance: alert; appears mildly fatigued, but nontoxic; speaking in full sentences and tolerating own secretions HEENT: NCAT; Ears: EACs clear, TMs pearly gray; Eyes: PERRL.  EOM grossly intact. Nose: nares patent without rhinorrhea, Throat: oropharynx clear, tonsils non erythematous or enlarged, uvula midline  Neck: supple without LAD Lungs: unlabored  respirations, symmetrical air entry; cough: absent; no respiratory distress; CTAB Heart: regular rate and rhythm.   Abdomen: soft, nondistended, normal active bowel sounds; nontender to palpation; no guarding  Skin: warm and dry Psychological: alert and cooperative; normal mood and affect  ASSESSMENT & PLAN:  1. Nausea and vomiting, intractability of vomiting not specified, unspecified vomiting type   2. Flu-like symptoms     Meds ordered this encounter  Medications  . ondansetron (ZOFRAN) 4 MG tablet    Sig: Take 1 tablet (4 mg total) by mouth every 6 (six) hours.    Dispense:  12 tablet    Refill:  0    Order Specific Question:   Supervising Provider    Answer:   Eustace Moore   COVID testing ordered.  It will take between 5-7 days for test results.  Someone will contact you regarding abnormal results.    In the meantime: You should remain isolated in your home for 10 days from symptom onset AND greater than 72 hours after symptoms resolution (absence of fever without the use of fever-reducing medication and improvement in respiratory symptoms), whichever is longer Get plenty of rest and push fluids Zofran for nausea Use OTC zyrtec for nasal congestion, runny nose, and/or sore throat Use OTC flonase for nasal congestion and runny nose Use medications daily for symptom relief Use OTC medications like ibuprofen or tylenol as needed fever or pain Call or go to the ED if you have any new or worsening symptoms such as fever, cough, shortness of breath, chest tightness, chest pain, turning blue, changes in mental status, etc...   Reviewed expectations re: course of current medical issues. Questions answered. Outlined signs and symptoms indicating need for more acute intervention. Patient verbalized understanding. After Visit Summary given.         [[id number]], PA-C 02/17/20 1404

## 2020-02-17 NOTE — Discharge Instructions (Signed)
COVID testing ordered.  It will take between 5-7 days for test results.  Someone will contact you regarding abnormal results.    In the meantime: You should remain isolated in your home for 10 days from symptom onset AND greater than 72 hours after symptoms resolution (absence of fever without the use of fever-reducing medication and improvement in respiratory symptoms), whichever is longer Get plenty of rest and push fluids Zofran for nausea Use OTC zyrtec for nasal congestion, runny nose, and/or sore throat Use OTC flonase for nasal congestion and runny nose Use medications daily for symptom relief Use OTC medications like ibuprofen or tylenol as needed fever or pain Call or go to the ED if you have any new or worsening symptoms such as fever, cough, shortness of breath, chest tightness, chest pain, turning blue, changes in mental status, etc...  

## 2020-02-17 NOTE — ED Triage Notes (Signed)
N/V/D , fatigue, headache,  runny nose x 1week.

## 2020-02-18 LAB — SARS-COV-2, NAA 2 DAY TAT

## 2020-02-18 LAB — NOVEL CORONAVIRUS, NAA: SARS-CoV-2, NAA: NOT DETECTED

## 2020-03-23 ENCOUNTER — Ambulatory Visit: Payer: Managed Care, Other (non HMO) | Admitting: Orthopedic Surgery

## 2020-07-23 ENCOUNTER — Other Ambulatory Visit: Payer: Self-pay | Admitting: Internal Medicine

## 2020-07-23 DIAGNOSIS — E785 Hyperlipidemia, unspecified: Secondary | ICD-10-CM

## 2020-08-10 ENCOUNTER — Other Ambulatory Visit: Payer: Managed Care, Other (non HMO)

## 2020-10-09 ENCOUNTER — Telehealth: Payer: Self-pay

## 2020-10-09 NOTE — Telephone Encounter (Signed)
Received surgical clearance for patient. Have called not able to leave voice mail. Will send my chart message needs to be seen to have filled out.   PPW in my folder.

## 2020-10-10 ENCOUNTER — Other Ambulatory Visit: Payer: Self-pay | Admitting: Primary Care

## 2020-10-10 DIAGNOSIS — M1A9XX0 Chronic gout, unspecified, without tophus (tophi): Secondary | ICD-10-CM

## 2020-10-10 MED ORDER — COLCHICINE 0.6 MG PO TABS: ORAL_TABLET | ORAL | 0 refills | Status: DC

## 2020-10-10 NOTE — Telephone Encounter (Signed)
Pt has called has appointment ppw in folder.

## 2020-10-16 ENCOUNTER — Ambulatory Visit: Payer: Managed Care, Other (non HMO) | Admitting: Primary Care

## 2020-10-16 ENCOUNTER — Other Ambulatory Visit: Payer: Self-pay

## 2020-10-16 ENCOUNTER — Other Ambulatory Visit: Payer: Self-pay | Admitting: Primary Care

## 2020-10-16 ENCOUNTER — Encounter: Payer: Self-pay | Admitting: Primary Care

## 2020-10-16 VITALS — BP 140/90 | HR 89 | Temp 98.4°F | Ht 71.0 in | Wt 225.2 lb

## 2020-10-16 DIAGNOSIS — Z01818 Encounter for other preprocedural examination: Secondary | ICD-10-CM | POA: Insufficient documentation

## 2020-10-16 DIAGNOSIS — M1A9XX0 Chronic gout, unspecified, without tophus (tophi): Secondary | ICD-10-CM | POA: Diagnosis not present

## 2020-10-16 DIAGNOSIS — I10 Essential (primary) hypertension: Secondary | ICD-10-CM

## 2020-10-16 DIAGNOSIS — R7303 Prediabetes: Secondary | ICD-10-CM | POA: Diagnosis not present

## 2020-10-16 MED ORDER — AMLODIPINE BESYLATE 5 MG PO TABS: 5.0000 mg | ORAL_TABLET | Freq: Every day | ORAL | 0 refills | Status: DC

## 2020-10-16 NOTE — Patient Instructions (Signed)
Stop by the lab prior to leaving today. I will notify you of your results once received.   Start amlodipine 5 mg once daily for blood pressure.  Please schedule a follow up visit to meet back with me in 2-3 weeks for blood pressure check.   It was a pleasure to see you today!

## 2020-10-16 NOTE — Assessment & Plan Note (Addendum)
Pending total right hip replacement August 1st. Exam today stable.  Will treat hypertension today and see him back in 2-3 weeks for BP check.   Labs pending.  ECG pending. Will complete surgical form once labs return.

## 2020-10-16 NOTE — Assessment & Plan Note (Signed)
Recent flare which has mostly resolved. Repeat uric acid level pending.

## 2020-10-16 NOTE — Assessment & Plan Note (Signed)
Noted today, also with several other readings. We discussed his long history of intermittently high readings, he would like to pursue treatment.  Rx for amlodipine 5 mg sent to pharmacy. We will see him back in 2-3 weeks for BP check.

## 2020-10-16 NOTE — Progress Notes (Signed)
Subjective:    Patient ID: Nathaniel Frank, male    DOB: 09-05-1985, 35 y.o.   MRN: [id number]  HPI  Nathaniel Frank is a very pleasant 35 y.o. male who presents today for surgical clearance.  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 He will undergo general anesthesia.   Long history of elevated blood pressure readings. Today he endorses that he's been "told for years that it's too high but no one does anything about it". He does have a family history of hypertension in his grandparents, not in his parents. He checks BP infrequently at home and gets readings of 140's/90's.    BP Readings from Last 3 Encounters:  10/16/20 140/90  02/17/20 (!) 159/81  02/10/20 124/68   Wt Readings from Last 3 Encounters:  10/16/20 225 lb 4 oz (102.2 kg)  02/17/20 228 lb (103.4 kg)  02/10/20 227 lb (103 kg)      Review of Systems  Constitutional:  Negative for fatigue and unexpected weight change.  Eyes:  Negative for visual disturbance.  Respiratory:  Negative for shortness of breath.   Cardiovascular:  Negative for chest pain.  Gastrointestinal:  Negative for constipation and diarrhea.  Genitourinary:  Negative for difficulty urinating.  Musculoskeletal:  Positive for arthralgias.  Neurological:  Negative for dizziness and headaches.        Past Medical History:  Diagnosis Date   Elevated blood pressure reading    GAD (generalized anxiety disorder)    PVC (premature ventricular contraction)     Social History   Socioeconomic History   Marital status: Legally Separated    Spouse name: Not on file   Number of children: Not on file   Years of education: Not on file   Highest education level: Not on file  Occupational History   Not on file  Tobacco Use   Smoking status: Never   Smokeless tobacco: Never  Substance and Sexual Activity   Alcohol use: Yes    Comment: soical   Drug use: Not on file   Sexual  activity: Not on file  Other Topics Concern   Not on file  Social History Narrative   Engaged.   3 children.   Works for [employer].   Enjoys golfing.    Social Determinants of Health   Financial Resource Strain: Not on file  Food Insecurity: Not on file  Transportation Needs: Not on file  Physical Activity: Not on file  Stress: Not on file  Social Connections: Not on file  Intimate Partner Violence: Not on file    Past Surgical History:  Procedure Laterality Date   TONSILLECTOMY AND ADENOIDECTOMY  1999    Family History  Problem Relation Age of Onset   Renal cancer Father     No Known Allergies  Current Outpatient Medications on File Prior to Visit  Medication Sig Dispense Refill   colchicine 0.6 MG tablet Take 2 tablets by mouth at gout onset, then 1 additional tablet 2 hours later. Then take 1 tablet twice daily until flare resolves. 60 tablet 0   diclofenac (VOLTAREN) 75 MG EC tablet Take 75 mg by mouth 2 (two) times daily.     hydrOXYzine (ATARAX/VISTARIL) 10 MG tablet Take 1 tablet (10 mg total) by mouth 2 (two) times daily as needed for anxiety. 30 tablet 0   triamcinolone (NASACORT) 55 MCG/ACT AERO nasal inhaler Place 2 sprays into the nose daily.  venlafaxine XR (EFFEXOR XR) 37.5 MG 24 hr capsule Take 1 capsule (37.5 mg total) by mouth daily with breakfast. For anxiety. Take with 75 mg dose. 90 capsule 3   venlafaxine XR (EFFEXOR-XR) 75 MG 24 hr capsule Take 1 capsule (75 mg total) by mouth daily with breakfast. For anxiety. 90 capsule 3   No current facility-administered medications on file prior to visit.    BP 140/90 (BP Location: Left Arm, Patient Position: Sitting, Cuff Size: Large)   Pulse 89   Temp 98.4 F (36.9 C) (Temporal)   Ht 5\' 11"  (1.803 m)   Wt 225 lb 4 oz (102.2 kg)   SpO2 98%   BMI 31.42 kg/m  Objective:   Physical Exam HENT:     Right Ear: Tympanic membrane and ear canal normal.     Left Ear: Tympanic membrane and ear canal normal.      Nose: Nose normal.     Right Sinus: No maxillary sinus tenderness or frontal sinus tenderness.     Left Sinus: No maxillary sinus tenderness or frontal sinus tenderness.  Eyes:     Conjunctiva/sclera: Conjunctivae normal.  Neck:     Thyroid: No thyromegaly.     Vascular: No carotid bruit.  Cardiovascular:     Rate and Rhythm: Normal rate and regular rhythm.     Heart sounds: Normal heart sounds.  Pulmonary:     Effort: Pulmonary effort is normal.     Breath sounds: Normal breath sounds. No wheezing or rales.  Abdominal:     General: Bowel sounds are normal.     Palpations: Abdomen is soft.     Tenderness: There is no abdominal tenderness.  Musculoskeletal:        General: Normal range of motion.     Cervical back: Neck supple.  Skin:    General: Skin is warm and dry.  Neurological:     Mental Status: He is alert and oriented to person, place, and time.     Cranial Nerves: No cranial nerve deficit.     Deep Tendon Reflexes: Reflexes are normal and symmetric.  Psychiatric:        Mood and Affect: Mood normal.          Assessment & Plan:      This visit occurred during the SARS-CoV-2 public health emergency.  Safety protocols were in place, including screening questions prior to the visit, additional usage of staff PPE, and extensive cleaning of exam room while observing appropriate contact time as indicated for disinfecting solutions.

## 2020-10-16 NOTE — Assessment & Plan Note (Signed)
Repeat A1C pending. 

## 2020-10-16 NOTE — Addendum Note (Signed)
Addended by: Alvina Chou on: 10/16/2020 03:07 PM   Modules accepted: Orders

## 2020-10-17 LAB — COMPREHENSIVE METABOLIC PANEL
ALT: 43 IU/L (ref 0–44)
AST: 31 IU/L (ref 0–40)
Albumin/Globulin Ratio: 2 (ref 1.2–2.2)
Albumin: 4.7 g/dL (ref 4.0–5.0)
Alkaline Phosphatase: 68 IU/L (ref 44–121)
BUN/Creatinine Ratio: 15 (ref 9–20)
BUN: 13 mg/dL (ref 6–20)
Bilirubin Total: 0.3 mg/dL (ref 0.0–1.2)
CO2: 24 mmol/L (ref 20–29)
Calcium: 10 mg/dL (ref 8.7–10.2)
Chloride: 97 mmol/L (ref 96–106)
Creatinine, Ser: 0.84 mg/dL (ref 0.76–1.27)
Globulin, Total: 2.4 g/dL (ref 1.5–4.5)
Glucose: 108 mg/dL — ABNORMAL HIGH (ref 65–99)
Potassium: 4.3 mmol/L (ref 3.5–5.2)
Sodium: 139 mmol/L (ref 134–144)
Total Protein: 7.1 g/dL (ref 6.0–8.5)
eGFR: 115 mL/min/{1.73_m2} (ref >59)

## 2020-10-17 LAB — CBC
Hematocrit: 40.9 % (ref 37.5–51.0)
Hemoglobin: 14.1 g/dL (ref 13.0–17.7)
MCH: 32.5 pg (ref 26.6–33.0)
MCHC: 34.5 g/dL (ref 31.5–35.7)
MCV: 94 fL (ref 79–97)
Platelets: 199 10*3/uL (ref 150–450)
RBC: 4.34 x10E6/uL (ref 4.14–5.80)
RDW: 13 % (ref 11.6–15.4)
WBC: 9.5 10*3/uL (ref 3.4–10.8)

## 2020-10-17 LAB — HEMOGLOBIN A1C
Est. average glucose Bld gHb Est-mCnc: 128 mg/dL
Hgb A1c MFr Bld: 6.1 % — ABNORMAL HIGH (ref 4.8–5.6)

## 2020-10-17 LAB — URIC ACID: Uric Acid: 8.7 mg/dL — ABNORMAL HIGH (ref 3.8–8.4)

## 2020-10-30 ENCOUNTER — Encounter: Payer: Self-pay | Admitting: Primary Care

## 2020-10-30 ENCOUNTER — Other Ambulatory Visit: Payer: Self-pay

## 2020-10-30 ENCOUNTER — Ambulatory Visit: Payer: Managed Care, Other (non HMO) | Admitting: Primary Care

## 2020-10-30 DIAGNOSIS — M1A9XX0 Chronic gout, unspecified, without tophus (tophi): Secondary | ICD-10-CM

## 2020-10-30 DIAGNOSIS — I10 Essential (primary) hypertension: Secondary | ICD-10-CM | POA: Diagnosis not present

## 2020-10-30 DIAGNOSIS — R7303 Prediabetes: Secondary | ICD-10-CM | POA: Diagnosis not present

## 2020-10-30 NOTE — Assessment & Plan Note (Signed)
Uric acid level of 8.7, but was nearing the end of a gout flare.   Will recheck uric acid level at next visit.

## 2020-10-30 NOTE — Progress Notes (Signed)
Subjective:    Patient ID: Nathaniel Frank, male    DOB: 1986-03-16, 35 y.o.   MRN: [id number]  HPI  Nathaniel Frank is a very pleasant 35 y.o. male with a history of hypertension, GAD, chronic gout, prediabetes who presents today for follow up of hypertension.  He was last evaluated on 10/16/20 for surgical clearance, BP was noted to be above goal, also during prior visits and with home readings. Given his upcoming surgery for avascular necrosis coupled with abnormal readings we decided to treat with amlodipine 5 mg. He is here today for re-evaluation.  Since his last visit he has been compliant to amlodipine 5 mg. He's not checking his BP at home. He denies chest pain, ankle edema. He has noticed some muscle cramping which is not bothersome.   His last gout flare has resolved. Gout flares occur twice annually on average.    BP Readings from Last 3 Encounters:  10/30/20 136/72  10/16/20 140/90  02/17/20 (!) 159/81      Review of Systems  Respiratory:  Negative for shortness of breath.   Cardiovascular:  Negative for chest pain and leg swelling.  Musculoskeletal:  Positive for myalgias.  Neurological:  Negative for headaches.        Past Medical History:  Diagnosis Date   Elevated blood pressure reading    GAD (generalized anxiety disorder)    PVC (premature ventricular contraction)     Social History   Socioeconomic History   Marital status: Legally Separated    Spouse name: Not on file   Number of children: Not on file   Years of education: Not on file   Highest education level: Not on file  Occupational History   Not on file  Tobacco Use   Smoking status: Never   Smokeless tobacco: Never  Substance and Sexual Activity   Alcohol use: Yes    Comment: soical   Drug use: Not on file   Sexual activity: Not on file  Other Topics Concern   Not on file  Social History Narrative   Engaged.   3 children.   Works for [employer].   Enjoys golfing.    Social  Determinants of Health   Financial Resource Strain: Not on file  Food Insecurity: Not on file  Transportation Needs: Not on file  Physical Activity: Not on file  Stress: Not on file  Social Connections: Not on file  Intimate Partner Violence: Not on file    Past Surgical History:  Procedure Laterality Date   TONSILLECTOMY AND ADENOIDECTOMY  1999    Family History  Problem Relation Age of Onset   Renal cancer Father     No Known Allergies  Current Outpatient Medications on File Prior to Visit  Medication Sig Dispense Refill   amLODipine (NORVASC) 5 MG tablet TAKE 1 TABLET(5 MG) BY MOUTH DAILY FOR BLOOD PRESSURE 90 tablet 0   colchicine 0.6 MG tablet Take 2 tablets by mouth at gout onset, then 1 additional tablet 2 hours later. Then take 1 tablet twice daily until flare resolves. 60 tablet 0   diclofenac (VOLTAREN) 75 MG EC tablet Take 75 mg by mouth 2 (two) times daily.     venlafaxine XR (EFFEXOR XR) 37.5 MG 24 hr capsule Take 1 capsule (37.5 mg total) by mouth daily with breakfast. For anxiety. Take with 75 mg dose. 90 capsule 3   venlafaxine XR (EFFEXOR-XR) 75 MG 24 hr capsule Take 1 capsule (75 mg total) by mouth daily with  breakfast. For anxiety. 90 capsule 3   hydrOXYzine (ATARAX/VISTARIL) 10 MG tablet Take 1 tablet (10 mg total) by mouth 2 (two) times daily as needed for anxiety. (Patient not taking: Reported on 10/30/2020) 30 tablet 0   triamcinolone (NASACORT) 55 MCG/ACT AERO nasal inhaler Place 2 sprays into the nose daily. (Patient not taking: Reported on 10/30/2020)     No current facility-administered medications on file prior to visit.    BP 136/72   Pulse 88   Temp (!) 97.1 F (36.2 C) (Temporal)   Ht 5\' 11"  (1.803 m)   Wt 222 lb (100.7 kg)   SpO2 97%   BMI 30.96 kg/m  Objective:   Physical Exam Cardiovascular:     Rate and Rhythm: Normal rate and regular rhythm.  Pulmonary:     Effort: Pulmonary effort is normal.     Breath sounds: Normal breath sounds.  No wheezing or rales.  Musculoskeletal:     Cervical back: Neck supple.  Skin:    General: Skin is warm and dry.  Neurological:     Mental Status: He is alert and oriented to person, place, and time.          Assessment & Plan:      This visit occurred during the SARS-CoV-2 public health emergency.  Safety protocols were in place, including screening questions prior to the visit, additional usage of staff PPE, and extensive cleaning of exam room while observing appropriate contact time as indicated for disinfecting solutions.

## 2020-10-30 NOTE — Assessment & Plan Note (Addendum)
Improved, will have him work on lifestyle changes for further reduction.  Overall muscle cramping isn't bothersome, he will update if so.  Continue amlodipine 5 mg.

## 2020-10-30 NOTE — Assessment & Plan Note (Signed)
A1C of 6.2 during last visit.  Poor diet that is loaded with carbs, discussed to limit. He will work on this. Repeat in 3 months.

## 2020-10-30 NOTE — Patient Instructions (Signed)
You have prediabetes which means that your blood sugar levels are slightly too high. Prediabetes means that you do not have diabetes, but you are at risk for developing diabetes if you do not work to reduce your blood sugar levels.   Decrease consumption of fast food, fried food, processed carbohydrates (chips, cookies, etc), sugary drinks, sweets. Increase consumption of fresh fruits and vegetables, whole grains, water.   You should be getting 150 minutes of moderate intensity exercise weekly.  It was a pleasure to see you today!  Prediabetes Eating Plan Prediabetes is a condition that causes blood sugar (glucose) levels to be higher than normal. This increases the risk for developing type 2 diabetes (type 2 diabetes mellitus). Working with a health care provider or nutrition specialist (dietitian) to make diet and lifestyle changes can help prevent the onset of diabetes. These changes may help you: Control your blood glucose levels. Improve your cholesterol levels. Manage your blood pressure. What are tips for following this plan? Reading food labels Read food labels to check the amount of fat, salt (sodium), and sugar in prepackaged foods. Avoid foods that have: Saturated fats. Trans fats. Added sugars. Avoid foods that have more than 300 milligrams (mg) of sodium per serving. Limit your sodium intake to less than 2,300 mg each day. Shopping Avoid buying pre-made and processed foods. Avoid buying drinks with added sugar. Cooking Cook with olive oil. Do not use butter, lard, or ghee. Bake, broil, grill, steam, or boil foods. Avoid frying. Meal planning  Work with your dietitian to create an eating plan that is right for you. This may include tracking how many calories you take in each day. Use a food diary, notebook, or mobile application to track what you eat at each meal. Consider following a Mediterranean diet. This includes: Eating several servings of fresh fruits and vegetables  each day. Eating fish at least twice a week. Eating one serving each day of whole grains, beans, nuts, and seeds. Using olive oil instead of other fats. Limiting alcohol. Limiting red meat. Using nonfat or low-fat dairy products. Consider following a plant-based diet. This includes dietary choices that focus on eating mostly vegetables and fruit, grains, beans, nuts, and seeds. If you have high blood pressure, you may need to limit your sodium intake or follow a diet such as the DASH (Dietary Approaches to Stop Hypertension) eating plan. The DASH diet aims to lower high blood pressure.  Lifestyle Set weight loss goals with help from your health care team. It is recommended that most people with prediabetes lose 7% of their body weight. Exercise for at least 30 minutes 5 or more days a week. Attend a support group or seek support from a mental health counselor. Take over-the-counter and prescription medicines only as told by your health care provider. What foods are recommended? Fruits Berries. Bananas. Apples. Oranges. Grapes. Papaya. Mango. Pomegranate. Kiwi.Grapefruit. Cherries. Vegetables Lettuce. Spinach. Peas. Beets. Cauliflower. Cabbage. Broccoli. Carrots.Tomatoes. Squash. Eggplant. Herbs. Peppers. Onions. Cucumbers. Brussels sprouts. Grains Whole grains, such as whole-wheat or whole-grain breads, crackers, cereals, and pasta. Unsweetened oatmeal. Bulgur. Barley. Quinoa. Brown rice. Corn orwhole-wheat flour tortillas or taco shells. Meats and other proteins Seafood. Poultry without skin. Lean cuts of pork and beef. Tofu. Eggs. Nuts.Beans. Dairy Low-fat or fat-free dairy products, such as yogurt, cottage cheese, and cheese. Beverages Water. Tea. Coffee. Sugar-free or diet soda. Seltzer water. Low-fat or nonfatmilk. Milk alternatives, such as soy or almond milk. Fats and oils Olive oil. Canola oil. Sunflower oil.  Grapeseed oil. Avocado. Walnuts. Sweets and desserts Sugar-free or  low-fat pudding. Sugar-free or low-fat ice cream and other frozentreats. Seasonings and condiments Herbs. Sodium-free spices. Mustard. Relish. Low-salt, low-sugar ketchup.Low-salt, low-sugar barbecue sauce. Low-fat or fat-free mayonnaise. The items listed above may not be a complete list of recommended foods and beverages. Contact a dietitian for more information. What foods are not recommended? Fruits Fruits canned with syrup. Vegetables Canned vegetables. Frozen vegetables with butter or cream sauce. Grains Refined white flour and flour products, such as bread, pasta, snack foods, andcereals. Meats and other proteins Fatty cuts of meat. Poultry with skin. Breaded or fried meat. Processed meats. Dairy Full-fat yogurt, cheese, or milk. Beverages Sweetened drinks, such as iced tea and soda. Fats and oils Butter. Lard. Ghee. Sweets and desserts Baked goods, such as cake, cupcakes, pastries, cookies, and cheesecake. Seasonings and condiments Spice mixes with added salt. Ketchup. Barbecue sauce. Mayonnaise. The items listed above may not be a complete list of foods and beverages that are not recommended. Contact a dietitian for more information. Where to find more information American Diabetes Association: www.diabetes.org Summary You may need to make diet and lifestyle changes to help prevent the onset of diabetes. These changes can help you control blood sugar, improve cholesterol levels, and manage blood pressure. Set weight loss goals with help from your health care team. It is recommended that most people with prediabetes lose 7% of their body weight. Consider following a Mediterranean diet. This includes eating plenty of fresh fruits and vegetables, whole grains, beans, nuts, seeds, fish, and low-fat dairy, and using olive oil instead of other fats. This information is not intended to replace advice given to you by your health care provider. Make sure you discuss any questions you  have with your healthcare provider. Document Revised: 07/07/2019 Document Reviewed: 07/07/2019 Elsevier Patient Education  2022 [employer].

## 2020-11-19 HISTORY — PX: TOTAL HIP ARTHROPLASTY: SHX124

## 2020-12-12 ENCOUNTER — Other Ambulatory Visit: Payer: Self-pay

## 2020-12-12 ENCOUNTER — Encounter: Payer: Self-pay | Admitting: Primary Care

## 2020-12-12 ENCOUNTER — Ambulatory Visit (INDEPENDENT_AMBULATORY_CARE_PROVIDER_SITE_OTHER): Payer: Managed Care, Other (non HMO) | Admitting: Primary Care

## 2020-12-12 VITALS — BP 124/72 | HR 76 | Temp 98.0°F | Ht 71.0 in | Wt 221.0 lb

## 2020-12-12 DIAGNOSIS — M1A9XX0 Chronic gout, unspecified, without tophus (tophi): Secondary | ICD-10-CM

## 2020-12-12 DIAGNOSIS — Z23 Encounter for immunization: Secondary | ICD-10-CM | POA: Diagnosis not present

## 2020-12-12 DIAGNOSIS — F411 Generalized anxiety disorder: Secondary | ICD-10-CM | POA: Diagnosis not present

## 2020-12-12 DIAGNOSIS — R7303 Prediabetes: Secondary | ICD-10-CM

## 2020-12-12 DIAGNOSIS — I10 Essential (primary) hypertension: Secondary | ICD-10-CM | POA: Diagnosis not present

## 2020-12-12 DIAGNOSIS — Z0000 Encounter for general adult medical examination without abnormal findings: Secondary | ICD-10-CM

## 2020-12-12 NOTE — Assessment & Plan Note (Addendum)
Improved, he has implemented significant dietary changes   Continue amlodipine 5 mg daily.  I evaluated patient, was consulted regarding treatment, and agree with assessment and plan per Kathaleen Maser, RN, DNP student.   Mayra Reel, NP-C

## 2020-12-12 NOTE — Progress Notes (Signed)
Established Patient Office Visit  Subjective:  Patient ID: Nathaniel Frank, male    DOB: 01-27-1986  Age: 35 y.o. MRN: [id number]  CC:  Chief Complaint  Patient presents with   Annual Exam    Not fasting     HPI Tripton Ned presents for complete physical and follow up of chronic conditions.  No acute complaints today.   Immunizations: -Tetanus: up to date, last 2019 -Influenza: given 8/26 -Covid-19: 1 dose J&J April 2021, 1 dose Phizer completed December 2021   Diet: 6 weeks ago, major dietary changes in effort to avoid DM diagnosis. Cut out processed foods, pizza, pasta, complex carbs, junk food. More fresh and low fat/calories food choices. Drinks approximately 60 oz. Of water per day.  Exercise: No regular exercise. Participates in at-home physical therapy regimens at home x2/day. Works at [employer], mostly sedentary at work. Tries to walk at work as much as possible.  Eye exam: due, last exam was more than 3 years ago Dental exam: Due, last exam was in 2011  BP Readings from Last 3 Encounters:  12/12/20 124/72  10/30/20 136/72  10/16/20 140/90         Past Medical History:  Diagnosis Date   Elevated blood pressure reading    GAD (generalized anxiety disorder)    PVC (premature ventricular contraction)     Past Surgical History:  Procedure Laterality Date   TONSILLECTOMY AND ADENOIDECTOMY  1999   TOTAL HIP ARTHROPLASTY Right 11/19/2020    Family History  Problem Relation Age of Onset   Renal cancer Father     Social History   Socioeconomic History   Marital status: Legally Separated    Spouse name: Not on file   Number of children: Not on file   Years of education: Not on file   Highest education level: Not on file  Occupational History   Not on file  Tobacco Use   Smoking status: Never   Smokeless tobacco: Never  Substance and Sexual Activity   Alcohol use: Yes    Comment: soical   Drug use: Not on file   Sexual activity: Not on file  Other  Topics Concern   Not on file  Social History Narrative   Engaged.   3 children.   Works for [employer].   Enjoys golfing.    Social Determinants of Health   Financial Resource Strain: Not on file  Food Insecurity: Not on file  Transportation Needs: Not on file  Physical Activity: Not on file  Stress: Not on file  Social Connections: Not on file  Intimate Partner Violence: Not on file    Outpatient Medications Prior to Visit  Medication Sig Dispense Refill   amLODipine (NORVASC) 5 MG tablet TAKE 1 TABLET(5 MG) BY MOUTH DAILY FOR BLOOD PRESSURE 90 tablet 0   aspirin EC 81 MG tablet Take 81 mg by mouth daily. Swallow whole.     colchicine 0.6 MG tablet Take 2 tablets by mouth at gout onset, then 1 additional tablet 2 hours later. Then take 1 tablet twice daily until flare resolves. 60 tablet 0   venlafaxine XR (EFFEXOR XR) 37.5 MG 24 hr capsule Take 1 capsule (37.5 mg total) by mouth daily with breakfast. For anxiety. Take with 75 mg dose. 90 capsule 3   venlafaxine XR (EFFEXOR-XR) 75 MG 24 hr capsule Take 1 capsule (75 mg total) by mouth daily with breakfast. For anxiety. 90 capsule 3   diclofenac (VOLTAREN) 75 MG EC tablet Take 75  mg by mouth 2 (two) times daily. (Patient not taking: Reported on 12/12/2020)     hydrOXYzine (ATARAX/VISTARIL) 10 MG tablet Take 1 tablet (10 mg total) by mouth 2 (two) times daily as needed for anxiety. (Patient not taking: No sig reported) 30 tablet 0   triamcinolone (NASACORT) 55 MCG/ACT AERO nasal inhaler Place 2 sprays into the nose daily. (Patient not taking: No sig reported)     No facility-administered medications prior to visit.    No Known Allergies  ROS Review of Systems  Constitutional: Negative.   HENT: Negative.    Eyes: Negative.   Cardiovascular: Negative.   Gastrointestinal:  Positive for constipation.       Mild constipation related to Oxycodone status post hip surgery   Endocrine: Negative.   Genitourinary: Negative.    Musculoskeletal:  Positive for myalgias.       Right quadriceps status post hip surgery  Skin:  Positive for rash.       Chest, has been successfully treated before with [facility]  Neurological: Negative.   Psychiatric/Behavioral: Negative.       Objective:    Physical Exam Constitutional:      Appearance: Normal appearance.  HENT:     Head: Normocephalic and atraumatic.     Nose: Nose normal.     Mouth/Throat:     Mouth: Mucous membranes are moist.  Eyes:     Extraocular Movements: Extraocular movements intact.     Conjunctiva/sclera: Conjunctivae normal.     Pupils: Pupils are equal, round, and reactive to light.  Cardiovascular:     Rate and Rhythm: Normal rate and regular rhythm.     Pulses: Normal pulses.     Heart sounds: Normal heart sounds.  Pulmonary:     Effort: Pulmonary effort is normal.     Breath sounds: Normal breath sounds.  Abdominal:     General: Bowel sounds are normal.     Palpations: Abdomen is soft.  Musculoskeletal:     Cervical back: Normal range of motion and neck supple.     Comments: Recent R hip arthroplasty, pain with ROM testing, strength and ROM intact  Skin:    General: Skin is warm and dry.     Capillary Refill: Capillary refill takes less than 2 seconds.     Findings: Rash present.     Comments: Chest wall rash, recurrent, treats with [facility]  Neurological:     General: No focal deficit present.     Mental Status: He is alert and oriented to person, place, and time.  Psychiatric:        Mood and Affect: Mood normal.    BP 124/72   Pulse 76   Temp 98 F (36.7 C) (Temporal)   Ht _0  (1.803 m)   Wt 221 lb (100.2 kg)   SpO2 98%   BMI 30.82 kg/m  Wt Readings from Last 3 Encounters:  12/12/20 221 lb (100.2 kg)  10/30/20 222 lb (100.7 kg)  10/16/20 225 lb 4 oz (102.2 kg)     Health Maintenance Due  Topic Date Due   INFLUENZA VACCINE  11/19/2020    There are no preventive care reminders to display for this  patient.  No results found for: TSH Lab Results  Component Value Date   WBC 9.5 10/16/2020   HGB 14.1 10/16/2020   HCT 40.9 10/16/2020   MCV 94 10/16/2020   PLT 199 10/16/2020   Lab Results  Component Value Date   NA  139 10/16/2020   K 4.3 10/16/2020   CO2 24 10/16/2020   GLUCOSE 108 (H) 10/16/2020   BUN 13 10/16/2020   CREATININE 0.84 10/16/2020   BILITOT 0.3 10/16/2020   ALKPHOS 68 10/16/2020   AST 31 10/16/2020   ALT 43 10/16/2020   PROT 7.1 10/16/2020   ALBUMIN 4.7 10/16/2020   CALCIUM 10.0 10/16/2020   EGFR 115 10/16/2020   Lab Results  Component Value Date   CHOL 288 (H) 02/03/2020   Lab Results  Component Value Date   HDL 42 02/03/2020   Lab Results  Component Value Date   LDLCALC 176 (H) 02/03/2020   Lab Results  Component Value Date   TRIG 359 (H) 02/03/2020   Lab Results  Component Value Date   CHOLHDL 6.9 (H) 02/03/2020   Lab Results  Component Value Date   HGBA1C 6.1 (H) 10/16/2020      Assessment & Plan:   Problem List Items Addressed This Visit   None   No orders of the defined types were placed in this encounter.   Follow-up: No follow-ups on file.    Ninfa Meeker, RN

## 2020-12-12 NOTE — Patient Instructions (Addendum)
So great to see you! Set up a lab-only appointment in 1 month to check lipid panel, A1C, and uric acid level. Please fast 4 hours prior to this appointment.   Preventive Care 79-37 Years Old, Male Preventive care refers to lifestyle choices and visits with your health care provider that can promote health and wellness. This includes: A yearly physical exam. This is also called an annual wellness visit. Regular dental and eye exams. Immunizations. Screening for certain conditions. Healthy lifestyle choices, such as: Eating a healthy diet. Getting regular exercise. Not using drugs or products that contain nicotine and tobacco. Limiting alcohol use. What can I expect for my preventive care visit? Physical exam Your health care provider may check your: Height and weight. These may be used to calculate your BMI (body mass index). BMI is a measurement that tells if you are at a healthy weight. Heart rate and blood pressure. Body temperature. Skin for abnormal spots. Counseling Your health care provider may ask you questions about your: Past medical problems. Family's medical history. Alcohol, tobacco, and drug use. Emotional well-being. Home life and relationship well-being. Sexual activity. Diet, exercise, and sleep habits. Work and work Astronomer. Access to firearms. What immunizations do I need?  Vaccines are usually given at various ages, according to a schedule. Your health care provider will recommend vaccines for you based on your age, medicalhistory, and lifestyle or other factors, such as travel or where you work. What tests do I need? Blood tests Lipid and cholesterol levels. These may be checked every 5 years starting at age 79. Hepatitis C test. Hepatitis B test. Screening  Diabetes screening. This is done by checking your blood sugar (glucose) after you have not eaten for a while (fasting). Genital exam to check for testicular cancer or hernias. STD (sexually  transmitted disease) testing, if you are at risk. Talk with your health care provider about your test results, treatment options,and if necessary, the need for more tests. Follow these instructions at home: Eating and drinking  Eat a healthy diet that includes fresh fruits and vegetables, whole grains, lean protein, and low-fat dairy products. Drink enough fluid to keep your urine pale yellow. Take vitamin and mineral supplements as recommended by your health care provider. Do not drink alcohol if your health care provider tells you not to drink. If you drink alcohol: Limit how much you have to 0-2 drinks a day. Be aware of how much alcohol is in your drink. In the [place] one drink equals one 12 oz bottle of beer (355 mL), one 5 oz glass of wine (148 mL), or one 1 oz glass of hard liquor (44 mL).  Lifestyle Take daily care of your teeth and gums. Brush your teeth every morning and night with fluoride toothpaste. Floss one time each day. Stay active. Exercise for at least 30 minutes 5 or more days each week. Do not use any products that contain nicotine or tobacco, such as cigarettes, e-cigarettes, and chewing tobacco. If you need help quitting, ask your health care provider. Do not use drugs. If you are sexually active, practice safe sex. Use a condom or other form of protection to prevent STIs (sexually transmitted infections). Find healthy ways to cope with stress, such as: Meditation, yoga, or listening to music. Journaling. Talking to a trusted person. Spending time with friends and family. Safety Always wear your seat belt while driving or riding in a vehicle. Do not drive: If you have been drinking alcohol. Do not ride with  someone who has been drinking. When you are tired or distracted. While texting. Wear a helmet and other protective equipment during sports activities. If you have firearms in your house, make sure you follow all gun safety procedures. Seek help if you have  been physically or sexually abused. What's next? Go to your health care provider once a year for an annual wellness visit. Ask your health care provider how often you should have your eyes and teeth checked. Stay up to date on all vaccines. This information is not intended to replace advice given to you by your health care provider. Make sure you discuss any questions you have with your healthcare provider. Document Revised: 12/22/2018 Document Reviewed: 04/01/2018 Elsevier Patient Education  2022 Elsevier Inc.      Influenza (Flu) Vaccine (Inactivated or Recombinant): What You Need to Know 1. Why get vaccinated? Influenza vaccine can prevent influenza (flu). Flu is a contagious disease that spreads around the [place] every year, usually between October and May. Anyone can get the flu, but it is more dangerous for some people. Infants and young children, people 33 years and older, pregnant people, and people with certain health conditions or a weakenedimmune system are at greatest risk of flu complications. Pneumonia, bronchitis, sinus infections, and ear infections are examples of flu-related complications. If you have a medical condition, such as heartdisease, cancer, or diabetes, flu can make it worse. Flu can cause fever and chills, sore throat, muscle aches, fatigue, cough, headache, and runny or stuffy nose. Some people may have vomiting and diarrhea,though this is more common in children than adults. In an average year, thousands of people in the [place] die from flu, and many more are hospitalized. Flu vaccine prevents millions of illnessesand flu-related visits to the doctor each year. 2. Influenza vaccines CDC recommends everyone 6 months and older get vaccinated every flu season. Children 6 months through 74 years of age may need 2 doses during a single flu season. Everyone else needs only 1 dose each flu season. It takes about 2 weeks for protection to develop after  vaccination. There are many flu viruses, and they are always changing. Each year a new flu vaccine is made to protect against the influenza viruses believed to be likely to cause disease in the upcoming flu season. Even when the vaccine doesn'texactly match these viruses, it may still provide some protection. Influenza vaccine does not cause flu. Influenza vaccine may be given at the same time as other vaccines. 3. Talk with your health care provider Tell your vaccination provider if the person getting the vaccine: Has had an allergic reaction after a previous dose of influenza vaccine, or has any severe, life-threatening allergies Has ever had Guillain-Barr Syndrome (also called "GBS") In some cases, your health care provider may decide to postpone influenzavaccination until a future visit. Influenza vaccine can be administered at any time during pregnancy. People who are or will be pregnant during influenza season should receive inactivatedinfluenza vaccine. People with minor illnesses, such as a cold, may be vaccinated. People who are moderately or severely ill should usually wait until they recover beforegetting influenza vaccine. Your health care provider can give you more information. 4. Risks of a vaccine reaction Soreness, redness, and swelling where the shot is given, fever, muscle aches, and headache can happen after influenza vaccination. There may be a very small increased risk of Guillain-Barr Syndrome (GBS) after inactivated influenza vaccine (the flu shot). Young children who get the flu shot along with  pneumococcal vaccine (PCV13) and/or DTaP vaccine at the same time might be slightly more likely to have a seizure caused by fever. Tell your health care provider if a child who isgetting flu vaccine has ever had a seizure. People sometimes faint after medical procedures, including vaccination. Tellyour provider if you feel dizzy or have vision changes or ringing in the ears. As with  any medicine, there is a very remote chance of a vaccine causing asevere allergic reaction, other serious injury, or death. 5. What if there is a serious problem? An allergic reaction could occur after the vaccinated person leaves the clinic. If you see signs of a severe allergic reaction (hives, swelling of the face and throat, difficulty breathing, a fast heartbeat, dizziness, or weakness), call 9-1-1 and get the person to the nearest hospital. For other signs that concern you, call your health care provider. Adverse reactions should be reported to the Vaccine Adverse Event Reporting System (VAERS). Your health care provider will usually file this report, or you can do it yourself. Visit the VAERS website at www.vaers.LAgents.no or call 407-156-4803. VAERS is only for reporting reactions, and VAERS staff members do not give medical advice. 6. The National Vaccine Injury Compensation Program The [employer] Vaccine Injury Compensation Program (VICP) is a federal program that was created to compensate people who may have been injured by certain vaccines. Claims regarding alleged injury or death due to vaccination have a time limit for filing, which may be as short as two years. Visit the VICP website at SpiritualWord.at or call 443-750-3393 to learn about the program and about filing a claim. 7. How can I learn more? Ask your health care provider. Call your local or state health department. Visit the website of the Food and Drug Administration (FDA) for vaccine package inserts and additional information at FinderList.no. Contact the Centers for Disease Control and Prevention (CDC): Call (315)764-5902 (1-800-CDC-INFO) or Visit CDC's website at BiotechRoom.com.cy. Vaccine Information Statement Inactivated Influenza Vaccine (11/25/2019) This information is not intended to replace advice given to you by your health care provider. Make sure you discuss any  questions you have with your healthcare provider. Document Revised: 01/12/2020 Document Reviewed: 01/12/2020 Elsevier Patient Education  2022 [employer].

## 2020-12-12 NOTE — Assessment & Plan Note (Addendum)
Last uric acid level was 8.7  Most recent gout flare was August 14th  Will schedule a 1 month lab visit to recheck uric acid level  I evaluated patient, was consulted regarding treatment, and agree with assessment and plan per Kathaleen Maser, RN, DNP student.   Mayra Reel, NP-C

## 2020-12-12 NOTE — Assessment & Plan Note (Addendum)
Influenza vaccination provided today.  Encouraged continuation of marked dietary changes for weight loss and lowering of A1C.  ROS and physical exam mostly unremarkable, exceptions include myalgias and constipation related to his recent right hip arthroplasty and opiates.  Will have 1 month lab follow up to check A1C, uric acid, and lipid panel.   I evaluated patient, was consulted regarding treatment, and agree with assessment and plan per Kathaleen Maser, RN, DNP student.   Mayra Reel, NP-C

## 2020-12-12 NOTE — Assessment & Plan Note (Addendum)
The additional 37.5 mg Venlafaxine is "wearing off too soon" in late afternoon. Will trial a regimen of taking 75 mg in the mornings and taking the 37.5 mg dose in the afternoon.   He will update.  I evaluated patient, was consulted regarding treatment, and agree with assessment and plan per Kathaleen Maser, RN, DNP student.   Mayra Reel, NP-C

## 2020-12-12 NOTE — Progress Notes (Signed)
Subjective:    Patient ID: Nathaniel Frank, male    DOB: [birth year], 35 y.o.   MRN: [id number]  HPI  Nathaniel Frank is a very pleasant 35 y.o. male who presents today for complete physical and follow up of chronic conditions.  Immunizations: -Tetanus: 2019 -Influenza: Completed today -Covid-19: 1 dose  Diet: Fair diet.  Exercise: No regular exercise.  Eye exam: Completes annually  Dental exam: Completes semi-annually   BP: 124/72       Review of Systems  Constitutional:  Negative for unexpected weight change.  HENT:  Negative for rhinorrhea.   Respiratory:  Negative for shortness of breath.   Cardiovascular:  Negative for chest pain.  Gastrointestinal:  Negative for constipation and diarrhea.  Genitourinary:  Negative for difficulty urinating.  Musculoskeletal:  Negative for arthralgias and myalgias.  Skin:  Negative for rash.  Allergic/Immunologic: Negative for environmental allergies.  Neurological:  Negative for dizziness, numbness and headaches.  Psychiatric/Behavioral:  The patient is not nervous/anxious.         Past Medical History:  Diagnosis Date   Elevated blood pressure reading    GAD (generalized anxiety disorder)    PVC (premature ventricular contraction)     Social History   Socioeconomic History   Marital status: Legally Separated    Spouse name: Not on file   Number of children: Not on file   Years of education: Not on file   Highest education level: Not on file  Occupational History   Not on file  Tobacco Use   Smoking status: Never   Smokeless tobacco: Never  Substance and Sexual Activity   Alcohol use: Yes    Comment: soical   Drug use: Not on file   Sexual activity: Not on file  Other Topics Concern   Not on file  Social History Narrative   Engaged.   3 children.   Works for [employer].   Enjoys golfing.    Social Determinants of Health   Financial Resource Strain: Not on file  Food Insecurity: Not on file  Transportation  Needs: Not on file  Physical Activity: Not on file  Stress: Not on file  Social Connections: Not on file  Intimate Partner Violence: Not on file    Past Surgical History:  Procedure Laterality Date   TONSILLECTOMY AND ADENOIDECTOMY  1999   TOTAL HIP ARTHROPLASTY Right 11/19/2020    Family History  Problem Relation Age of Onset   Renal cancer Father     No Known Allergies  Current Outpatient Medications on File Prior to Visit  Medication Sig Dispense Refill   amLODipine (NORVASC) 5 MG tablet TAKE 1 TABLET(5 MG) BY MOUTH DAILY FOR BLOOD PRESSURE 90 tablet 0   aspirin EC 81 MG tablet Take 81 mg by mouth daily. Swallow whole.     colchicine 0.6 MG tablet Take 2 tablets by mouth at gout onset, then 1 additional tablet 2 hours later. Then take 1 tablet twice daily until flare resolves. 60 tablet 0   venlafaxine XR (EFFEXOR XR) 37.5 MG 24 hr capsule Take 1 capsule (37.5 mg total) by mouth daily with breakfast. For anxiety. Take with 75 mg dose. 90 capsule 3   venlafaxine XR (EFFEXOR-XR) 75 MG 24 hr capsule Take 1 capsule (75 mg total) by mouth daily with breakfast. For anxiety. 90 capsule 3   diclofenac (VOLTAREN) 75 MG EC tablet Take 75 mg by mouth 2 (two) times daily. (Patient not taking: Reported on 12/12/2020)  hydrOXYzine (ATARAX/VISTARIL) 10 MG tablet Take 1 tablet (10 mg total) by mouth 2 (two) times daily as needed for anxiety. (Patient not taking: No sig reported) 30 tablet 0   triamcinolone (NASACORT) 55 MCG/ACT AERO nasal inhaler Place 2 sprays into the nose daily. (Patient not taking: No sig reported)     No current facility-administered medications on file prior to visit.    BP 124/72   Pulse 76   Temp 98 F (36.7 C) (Temporal)   Ht 5\' 11"  (1.803 m)   Wt 221 lb (100.2 kg)   SpO2 98%   BMI 30.82 kg/m  Objective:   Physical Exam HENT:     Right Ear: Tympanic membrane and ear canal normal.     Left Ear: Tympanic membrane and ear canal normal.     Nose: Nose  normal.     Right Sinus: No maxillary sinus tenderness or frontal sinus tenderness.     Left Sinus: No maxillary sinus tenderness or frontal sinus tenderness.  Eyes:     Conjunctiva/sclera: Conjunctivae normal.  Neck:     Thyroid: No thyromegaly.     Vascular: No carotid bruit.  Cardiovascular:     Rate and Rhythm: Normal rate and regular rhythm.     Heart sounds: Normal heart sounds.  Pulmonary:     Effort: Pulmonary effort is normal.     Breath sounds: Normal breath sounds. No wheezing or rales.  Abdominal:     General: Bowel sounds are normal.     Palpations: Abdomen is soft.     Tenderness: There is no abdominal tenderness.  Musculoskeletal:        General: Normal range of motion.     Cervical back: Neck supple.  Skin:    General: Skin is warm and dry.  Neurological:     Mental Status: He is alert and oriented to person, place, and time.     Cranial Nerves: No cranial nerve deficit.     Deep Tendon Reflexes: Reflexes are normal and symmetric.  Psychiatric:        Mood and Affect: Mood normal.          Assessment & Plan:      This visit occurred during the SARS-CoV-2 public health emergency.  Safety protocols were in place, including screening questions prior to the visit, additional usage of staff PPE, and extensive cleaning of exam room while observing appropriate contact time as indicated for disinfecting solutions.

## 2020-12-12 NOTE — Assessment & Plan Note (Addendum)
A1C of 6.1 during last visit  Dietary changes have been made, significant carb intake reduction   Repeat A1C in 1 month  I evaluated patient, was consulted regarding treatment, and agree with assessment and plan per Kathaleen Maser, RN, DNP student.   Mayra Reel, NP-C

## 2020-12-13 ENCOUNTER — Other Ambulatory Visit: Payer: Self-pay | Admitting: Primary Care

## 2020-12-13 DIAGNOSIS — F411 Generalized anxiety disorder: Secondary | ICD-10-CM

## 2021-01-03 ENCOUNTER — Other Ambulatory Visit: Payer: Self-pay | Admitting: Primary Care

## 2021-01-03 DIAGNOSIS — F411 Generalized anxiety disorder: Secondary | ICD-10-CM

## 2021-01-12 ENCOUNTER — Other Ambulatory Visit: Payer: Self-pay | Admitting: Primary Care

## 2021-01-12 DIAGNOSIS — I10 Essential (primary) hypertension: Secondary | ICD-10-CM

## 2021-01-13 ENCOUNTER — Other Ambulatory Visit: Payer: Self-pay

## 2021-01-13 DIAGNOSIS — I10 Essential (primary) hypertension: Secondary | ICD-10-CM

## 2021-03-05 ENCOUNTER — Ambulatory Visit: Payer: Managed Care, Other (non HMO) | Admitting: Primary Care

## 2021-03-07 ENCOUNTER — Encounter: Payer: Self-pay | Admitting: Nurse Practitioner

## 2021-03-07 ENCOUNTER — Other Ambulatory Visit: Payer: Self-pay

## 2021-03-07 ENCOUNTER — Telehealth: Payer: Managed Care, Other (non HMO) | Admitting: Primary Care

## 2021-03-07 ENCOUNTER — Ambulatory Visit (INDEPENDENT_AMBULATORY_CARE_PROVIDER_SITE_OTHER): Payer: Managed Care, Other (non HMO) | Admitting: Nurse Practitioner

## 2021-03-07 VITALS — BP 138/100 | HR 88 | Temp 97.0°F | Resp 12 | Ht 71.0 in | Wt 215.5 lb

## 2021-03-07 DIAGNOSIS — L989 Disorder of the skin and subcutaneous tissue, unspecified: Secondary | ICD-10-CM | POA: Diagnosis not present

## 2021-03-07 DIAGNOSIS — H04552 Acquired stenosis of left nasolacrimal duct: Secondary | ICD-10-CM

## 2021-03-07 DIAGNOSIS — I10 Essential (primary) hypertension: Secondary | ICD-10-CM

## 2021-03-07 NOTE — Patient Instructions (Signed)
Nice to see you It should keep healing up Monitor your BP at least 3 times weekly and report it to kate.  If the tearing continues see an eye professional

## 2021-03-07 NOTE — Assessment & Plan Note (Signed)
Recommend patient check blood pressures at home and occasional readings if are consistently high diastolically he may need increase in his amlodipine.  States he takes the medication at night so has not had it today.  Continue to monitor

## 2021-03-07 NOTE — Assessment & Plan Note (Signed)
Patient is on the other side tear duct obstruction.  He is warm compresses seem to help did have a little redness and swelling at some point has self resolved and is feeling better.  Today feels obstructed at current time.  Did give signs and symptoms as well as seek urgent or emergent health care.  Also can reach out if he does not continue to get better recommend he see eye professional in regards to excessive tearing.

## 2021-03-07 NOTE — Assessment & Plan Note (Signed)
Looks like a scab near the lacrimal duct.  Therefore patient did not try to touch or scratch as this can increase risk for infection.  Patient acknowledged.  We will continue to monitor continue using warm compresses and allow skin to heal fully symptoms do not improve he can return reach out via MyChart also recommend seeing eye professional without excessive tearing.

## 2021-03-07 NOTE — Progress Notes (Signed)
Acute Office Visit  Subjective:    Patient ID: Nathaniel Frank, male    DOB: [birth year], 35 y.o.   MRN: [id number]  Chief Complaint  Patient presents with   Cut near tear duct of left eye    Started with eyes watering x 4 months ago, then had issues with tear duct of the left eye been sore. Then had a hole near the tear duct and now it looks like a cut/scab    HPI Patient is in today for eye issue  Left eye starts to water constantly for approx 4 months Tear duct stated hurting, which has resolved States the hole appeared 2 weeks ago on skin near lacrimal duct. States that his bottom lid is matted in the morning. Does not feel that it is purulent. No other diescharge   Has tried warm compresses that helped Past Medical History:  Diagnosis Date   Elevated blood pressure reading    GAD (generalized anxiety disorder)    PVC (premature ventricular contraction)     Past Surgical History:  Procedure Laterality Date   TONSILLECTOMY AND ADENOIDECTOMY  1999   TOTAL HIP ARTHROPLASTY Right 11/19/2020    Family History  Problem Relation Age of Onset   Renal cancer Father     Social History   Socioeconomic History   Marital status: Married    Spouse name: Not on file   Number of children: Not on file   Years of education: Not on file   Highest education level: Not on file  Occupational History   Not on file  Tobacco Use   Smoking status: Never   Smokeless tobacco: Never  Substance and Sexual Activity   Alcohol use: Yes    Comment: soical   Drug use: Not on file   Sexual activity: Not on file  Other Topics Concern   Not on file  Social History Narrative   Engaged.   3 children.   Works for [employer].   Enjoys golfing.    Social Determinants of Health   Financial Resource Strain: Not on file  Food Insecurity: Not on file  Transportation Needs: Not on file  Physical Activity: Not on file  Stress: Not on file  Social Connections: Not on file  Intimate Partner  Violence: Not on file    Outpatient Medications Prior to Visit  Medication Sig Dispense Refill   amLODipine (NORVASC) 5 MG tablet TAKE 1 TABLET(5 MG) BY MOUTH DAILY FOR BLOOD PRESSURE 90 tablet 3   colchicine 0.6 MG tablet Take 2 tablets by mouth at gout onset, then 1 additional tablet 2 hours later. Then take 1 tablet twice daily until flare resolves. 60 tablet 0   venlafaxine XR (EFFEXOR-XR) 37.5 MG 24 hr capsule TAKE 1 CAPSULE BY MOUTH  DAILY WITH BREAKFAST FOR  ANXIETY (TAKE WITH 75MG  DOSE) 90 capsule 3   venlafaxine XR (EFFEXOR-XR) 75 MG 24 hr capsule TAKE 1 CAPSULE BY MOUTH  DAILY WITH BREAKFAST FOR  ANXIETY 90 capsule 3   fluconazole (DIFLUCAN) 200 MG tablet Take by mouth. (Patient not taking: Reported on 03/07/2021)     ketoconazole (NIZORAL) 2 % cream Apply topically. (Patient not taking: Reported on 03/07/2021)     aspirin EC 81 MG tablet Take 81 mg by mouth daily. Swallow whole.     triamcinolone (NASACORT) 55 MCG/ACT AERO nasal inhaler Place 2 sprays into the nose daily. (Patient not taking: No sig reported)     No facility-administered medications prior to visit.  No Known Allergies  Review of Systems  Constitutional:  Negative for chills and fever.  HENT:  Negative for sinus pressure and sinus pain.   Eyes:  Positive for discharge. Negative for photophobia, pain, redness, itching and visual disturbance.       Excessive tearing to left eye  Respiratory:  Negative for cough and shortness of breath.   Cardiovascular:  Negative for chest pain.  Gastrointestinal:  Negative for abdominal pain, diarrhea, nausea and vomiting.      Objective:    Physical Exam Vitals and nursing note reviewed.  Constitutional:      Appearance: Normal appearance.  HENT:     Right Ear: Tympanic membrane, ear canal and external ear normal. There is no impacted cerumen.     Left Ear: Tympanic membrane, ear canal and external ear normal. There is no impacted cerumen.     Mouth/Throat:      Mouth: Mucous membranes are moist.     Pharynx: Oropharynx is clear.  Eyes:     General:        Right eye: No foreign body, discharge or hordeolum.        Left eye: No foreign body, discharge or hordeolum.     Extraocular Movements: Extraocular movements intact.     Pupils: Pupils are equal, round, and reactive to light.     Comments: Equal feeling non tender bilateral lacrimal ducts  Skin:         Comments: Non tender to palpation.   Neurological:     Mental Status: He is alert.     BP (!) 138/100   Pulse 88   Temp (!) 97 F (36.1 C)   Resp 12   Ht 5' 11"  (1.803 m)   Wt 215 lb 8 oz (97.8 kg)   SpO2 99%   BMI 30.06 kg/m  Wt Readings from Last 3 Encounters:  03/07/21 215 lb 8 oz (97.8 kg)  12/12/20 221 lb (100.2 kg)  10/30/20 222 lb (100.7 kg)    Health Maintenance Due  Topic Date Due   COVID-19 Vaccine (3 - Booster for Janssen series) 06/06/2020    There are no preventive care reminders to display for this patient.   No results found for: TSH Lab Results  Component Value Date   WBC 9.5 10/16/2020   HGB 14.1 10/16/2020   HCT 40.9 10/16/2020   MCV 94 10/16/2020   PLT 199 10/16/2020   Lab Results  Component Value Date   NA 139 10/16/2020   K 4.3 10/16/2020   CO2 24 10/16/2020   GLUCOSE 108 (H) 10/16/2020   BUN 13 10/16/2020   CREATININE 0.84 10/16/2020   BILITOT 0.3 10/16/2020   ALKPHOS 68 10/16/2020   AST 31 10/16/2020   ALT 43 10/16/2020   PROT 7.1 10/16/2020   ALBUMIN 4.7 10/16/2020   CALCIUM 10.0 10/16/2020   EGFR 115 10/16/2020   Lab Results  Component Value Date   CHOL 288 (H) 02/03/2020   Lab Results  Component Value Date   HDL 42 02/03/2020   Lab Results  Component Value Date   LDLCALC 176 (H) 02/03/2020   Lab Results  Component Value Date   TRIG 359 (H) 02/03/2020   Lab Results  Component Value Date   CHOLHDL 6.9 (H) 02/03/2020   Lab Results  Component Value Date   HGBA1C 6.1 (H) 10/16/2020       Assessment & Plan:    Problem List Items Addressed This Visit  Cardiovascular and Mediastinum   Essential hypertension    Recommend patient check blood pressures at home and occasional readings if are consistently high diastolically he may need increase in his amlodipine.  States he takes the medication at night so has not had it today.  Continue to monitor        Musculoskeletal and Integument   Skin lesion - Primary    Looks like a scab near the lacrimal duct.  Therefore patient did not try to touch or scratch as this can increase risk for infection.  Patient acknowledged.  We will continue to monitor continue using warm compresses and allow skin to heal fully symptoms do not improve he can return reach out via MyChart also recommend seeing eye professional without excessive tearing.        Other   Obstruction of left tear duct    Patient is on the other side tear duct obstruction.  He is warm compresses seem to help did have a little redness and swelling at some point has self resolved and is feeling better.  Today feels obstructed at current time.  Did give signs and symptoms as well as seek urgent or emergent health care.  Also can reach out if he does not continue to get better recommend he see eye professional in regards to excessive tearing.        No orders of the defined types were placed in this encounter.  This visit occurred during the SARS-CoV-2 public health emergency.  Safety protocols were in place, including screening questions prior to the visit, additional usage of staff PPE, and extensive cleaning of exam room while observing appropriate contact time as indicated for disinfecting solutions.   Romilda Garret, NP

## 2021-06-05 ENCOUNTER — Other Ambulatory Visit: Payer: Self-pay

## 2021-06-05 ENCOUNTER — Ambulatory Visit
Admission: EM | Admit: 2021-06-05 | Discharge: 2021-06-05 | Disposition: A | Discharge status: Home / Self Care | Payer: Managed Care, Other (non HMO) | Attending: Family Medicine | Admitting: Family Medicine

## 2021-06-05 DIAGNOSIS — R112 Nausea with vomiting, unspecified: Secondary | ICD-10-CM

## 2021-06-05 DIAGNOSIS — Z20828 Contact with and (suspected) exposure to other viral communicable diseases: Secondary | ICD-10-CM | POA: Diagnosis not present

## 2021-06-05 DIAGNOSIS — R197 Diarrhea, unspecified: Secondary | ICD-10-CM

## 2021-06-05 DIAGNOSIS — R5383 Other fatigue: Secondary | ICD-10-CM | POA: Diagnosis not present

## 2021-06-05 DIAGNOSIS — R519 Headache, unspecified: Secondary | ICD-10-CM

## 2021-06-05 MED ORDER — ONDANSETRON 4 MG PO TBDP: 4.0000 mg | ORAL_TABLET | Freq: Three times a day (TID) | ORAL | 0 refills | Status: DC | PRN

## 2021-06-05 NOTE — ED Provider Notes (Signed)
[facility]    CSN: [id number] Arrival date & time: 06/05/21  1449      History   Chief Complaint Chief Complaint  Patient presents with   Abdominal Pain    Cough, nausea and vomiting    HPI Nathaniel Frank is a 36 y.o. male.   Presenting today with 2-day history of nausea, vomiting, fatigue, headaches, diarrhea.  Some abdominal pain off and on as well.  Denies cough, congestion, chest pain, shortness of breath, rashes.  So far not tried anything over-the-counter for symptoms.  No new medications, sick contacts, recent travel.  Past Medical History:  Diagnosis Date   Elevated blood pressure reading    GAD (generalized anxiety disorder)    PVC (premature ventricular contraction)    Patient Active Problem List   Diagnosis Date Noted   Obstruction of left tear duct 03/07/2021   Skin lesion 03/07/2021   Preoperative clearance 10/16/2020   Prediabetes 02/10/2020   Acute non-recurrent maxillary sinusitis 06/22/2019   Chronic gout 01/09/2017   GAD (generalized anxiety disorder) 10/14/2016   Preventative health care 10/14/2016   Allergic rhinitis 01/04/2016   Essential hypertension 01/04/2016    Past Surgical History:  Procedure Laterality Date   TONSILLECTOMY AND ADENOIDECTOMY  1999   TOTAL HIP ARTHROPLASTY Right 11/19/2020     Home Medications    Prior to Admission medications   Medication Sig Start Date End Date Taking? Authorizing Provider  ondansetron (ZOFRAN-ODT) 4 MG disintegrating tablet Take 1 tablet (4 mg total) by mouth every 8 (eight) hours as needed for nausea or vomiting. 06/05/21  Yes Particia Nearing, PA-C  amLODipine (NORVASC) 5 MG tablet TAKE 1 TABLET(5 MG) BY MOUTH DAILY FOR BLOOD PRESSURE 01/13/21   Doreene Nest, NP  colchicine 0.6 MG tablet Take 2 tablets by mouth at gout onset, then 1 additional tablet 2 hours later. Then take 1 tablet twice daily until flare resolves. 10/10/20   Doreene Nest, NP  fluconazole (DIFLUCAN)  200 MG tablet Take by mouth. Patient not taking: Reported on 03/07/2021 03/06/21   [provider]  ketoconazole (NIZORAL) 2 % cream Apply topically. Patient not taking: Reported on 03/07/2021 03/06/21   [provider]  venlafaxine XR (EFFEXOR-XR) 37.5 MG 24 hr capsule TAKE 1 CAPSULE BY MOUTH  DAILY WITH BREAKFAST FOR  ANXIETY (TAKE WITH 75MG   DOSE) 12/14/20   12/16/20, NP  venlafaxine XR (EFFEXOR-XR) 75 MG 24 hr capsule TAKE 1 CAPSULE BY MOUTH  DAILY WITH BREAKFAST FOR  ANXIETY 01/03/21   01/05/21, NP   Family History Family History  Problem Relation Age of Onset   Renal cancer Father    Social History Social History   Tobacco Use   Smoking status: Never   Smokeless tobacco: Never  Vaping Use   Vaping Use: Every day   Substances: Flavoring  Substance Use Topics   Alcohol use: Yes    Comment: soical   Drug use: Yes    Types: Marijuana   Allergies   Patient has no known allergies.   Review of Systems Review of Systems Per HPI  Physical Exam Triage Vital Signs ED Triage Vitals [06/05/21 1609]  Enc Vitals Group     BP (!) 158/106     Pulse Rate 98     Resp      Temp 99.3 F (37.4 C)     Temp Source Oral     SpO2 98 %     Weight  Height      Head Circumference      Peak Flow      Pain Score 4     Pain Loc      Pain Edu?      Excl. in GC?    No data found.  Updated Vital Signs BP (!) 158/106 (BP Location: Right Arm)    Pulse 98    Temp 99.3 F (37.4 C) (Oral)    SpO2 98%   Visual Acuity Right Eye Distance:   Left Eye Distance:   Bilateral Distance:    Right Eye Near:   Left Eye Near:    Bilateral Near:     Physical Exam Vitals and nursing note reviewed.  Constitutional:      Appearance: Normal appearance.  HENT:     Head: Atraumatic.     Mouth/Throat:     Mouth: Mucous membranes are moist.  Eyes:     Extraocular Movements: Extraocular movements intact.     Conjunctiva/sclera: Conjunctivae normal.   Cardiovascular:     Rate and Rhythm: Normal rate and regular rhythm.  Pulmonary:     Effort: Pulmonary effort is normal.     Breath sounds: Normal breath sounds.  Abdominal:     General: Bowel sounds are normal. There is no distension.     Palpations: Abdomen is soft.     Tenderness: There is no abdominal tenderness. There is no right CVA tenderness, left CVA tenderness or guarding.  Musculoskeletal:        General: Normal range of motion.     Cervical back: Normal range of motion and neck supple.  Skin:    General: Skin is warm and dry.  Neurological:     General: No focal deficit present.     Mental Status: He is oriented to person, place, and time.  Psychiatric:        Mood and Affect: Mood normal.        Thought Content: Thought content normal.        Judgment: Judgment normal.   UC Treatments / Results  Labs (all labs ordered are listed, but only abnormal results are displayed) Labs Reviewed  COVID-19, FLU A+B NAA    EKG  Radiology No results found.  Procedures Procedures (including critical care time)  Medications Ordered in UC Medications - No data to display  Initial Impression / Assessment and Plan / UC Course  I have reviewed the triage vital signs and the nursing notes.  Pertinent labs & imaging results that were available during my care of the patient were reviewed by me and considered in my medical decision making (see chart for details).     Vital signs overall reassuring, mildly hypertensive but otherwise within normal limits.  Exam very reassuring.  Suspect viral GI illness causing symptoms.  Will treat with Zofran, brat diet, fluids.  Work note given.  Return for acutely worsening symptoms.  Final Clinical Impressions(s) / UC Diagnoses   Final diagnoses:  Exposure to the flu  Nausea vomiting and diarrhea  Acute nonintractable headache, unspecified headache type  Other fatigue   Discharge Instructions   None    ED Prescriptions      Medication Sig Dispense Auth. Provider   ondansetron (ZOFRAN-ODT) 4 MG disintegrating tablet Take 1 tablet (4 mg total) by mouth every 8 (eight) hours as needed for nausea or vomiting. 20 tablet Particia Nearing, [place]      PDMP not reviewed this encounter.   Particia Nearing,  PA-C 06/05/21 1752

## 2021-06-05 NOTE — ED Triage Notes (Signed)
Patient states that on Monday  he started not feeling well with nausea and feeling fatigue  Patient states that later that day he woke up vomiting, nausea and stomach burning and loose stools   Patient states he has vomited for the past 3 days

## 2021-06-06 LAB — COVID-19, FLU A+B NAA
Influenza A, NAA: NOT DETECTED
Influenza B, NAA: NOT DETECTED
SARS-CoV-2, NAA: NOT DETECTED

## 2021-10-29 LAB — LIPID PANEL
Cholesterol: 290 — AB (ref 0–200)
HDL: 42 (ref 35–70)
LDL Cholesterol: 136
Triglycerides: 593 — AB (ref 40–160)

## 2021-10-30 ENCOUNTER — Telehealth: Payer: Self-pay | Admitting: Primary Care

## 2021-10-30 NOTE — Telephone Encounter (Signed)
Called patient back states he received message for setting up office visit for bp. Has had elevated readings for last few visits with urgent care or "wellness nurse". Patient has not had any red words. If any he will go to ED. Set up for follow up in office tomorrow at 320pm

## 2021-10-30 NOTE — Telephone Encounter (Signed)
LVM for patient to callback in regards to the appointment he requested through MyChart. Patients states his blood pressure was 172/118. Needing additional information.

## 2021-10-30 NOTE — Telephone Encounter (Signed)
Noted, will evaluate as scheduled.  

## 2021-10-30 NOTE — Telephone Encounter (Signed)
Pt called back and asked to speak with the nurse about his blood pressure, please return a callback when possible. Thank you  Callback #: [phone]

## 2021-10-31 ENCOUNTER — Ambulatory Visit: Payer: Managed Care, Other (non HMO) | Admitting: Primary Care

## 2021-10-31 NOTE — Telephone Encounter (Signed)
Called patient advised does need to follow up for blood pressure. He wanted to schedule a cpe. He is not due for that until late august. He is not able to come in today for appointment. He has rescheduled for later next week.

## 2021-10-31 NOTE — Telephone Encounter (Signed)
[facility] Night - Client Nonclinical Telephone Record  [facility] Client [facility] Night - Client Client Site [facility] - Night Provider Vernona Rieger - NP Contact Type Call Who Is Calling Patient / Member / Family / Caregiver Caller Name Vallen Calabrese Caller Phone Number [phone] Patient Name Nathaniel Frank Patient DOB 02/24/1986 Call Type Message Only Information Provided Reason for Call Request to Reschedule Office Appointment Initial Comment Caller states he has an apt for tomorrow. Hes been monitoring his blood pressure and its hasn't moved. He wants to cancel his apt for tomorrow and reschedule. Patient request to speak to RN No Additional Comment Office hours provided. Disp. Time Disposition Final User 10/30/2021 11:07:15 PM General Information Provided Yes Elicia Lamp Call Closed By: Elicia Lamp Transaction Date/Time: 10/30/2021 11:04:49 PM (ET

## 2021-11-08 ENCOUNTER — Ambulatory Visit: Payer: Managed Care, Other (non HMO) | Admitting: Primary Care

## 2021-11-08 ENCOUNTER — Encounter: Payer: Self-pay | Admitting: Primary Care

## 2021-11-08 VITALS — BP 146/88 | HR 109 | Temp 98.6°F | Ht 71.0 in | Wt 223.0 lb

## 2021-11-08 DIAGNOSIS — E782 Mixed hyperlipidemia: Secondary | ICD-10-CM

## 2021-11-08 DIAGNOSIS — I10 Essential (primary) hypertension: Secondary | ICD-10-CM | POA: Diagnosis not present

## 2021-11-08 DIAGNOSIS — E785 Hyperlipidemia, unspecified: Secondary | ICD-10-CM | POA: Insufficient documentation

## 2021-11-08 MED ORDER — AMLODIPINE BESYLATE 10 MG PO TABS: 10.0000 mg | ORAL_TABLET | Freq: Every day | ORAL | 0 refills | Status: DC

## 2021-11-08 NOTE — Assessment & Plan Note (Signed)
Mostly high triglycerides.  Suspect part of this is hereditary, but we did discuss the importance of regular exercise and a healthy diet.  Start fish oil at 1000 mg twice daily with meals. Repeat lipid panel again during his next visit.

## 2021-11-08 NOTE — Assessment & Plan Note (Signed)
Uncontrolled today, also with home readings and prior office visits.  Increase amlodipine to 10 mg daily. He will continue to monitor blood pressure at home.  We will plan to see him back in the office in 1 month.

## 2021-11-08 NOTE — Patient Instructions (Signed)
We increased the dose of your amlodipine to 10 mg daily for blood pressure.  Start Fish Oil capsules. Take 1000 mg twice daily with a meal.   Schedule your physical for late August 2023 (after 12/12/21).   It was a pleasure to see you today!

## 2021-11-08 NOTE — Progress Notes (Signed)
Subjective:    Patient ID: Nathaniel Frank, male    DOB: November 28, 1985, 36 y.o.   MRN: [id number]  Hypertension Associated symptoms include headaches. Pertinent negatives include no chest pain or shortness of breath.    Nathaniel Frank is a very pleasant 36 y.o. male with a history of hypertension, GAD, chronic gout, prediabetes who presents today to discuss hypertension.   1) Essential Hypertension: Currently managed on amlodipine 5 mg daily which was initiated in June 2022.  He endorses initial improvement in his hypertension at that time, but several months later he began noticing elevated readings.  He's been checking his BP at home which is running 140's/90's for the last several months. He does notice a throbbing sensation to his head and blurred vision at times, this has been more frequent recently.   He denies increased stress. He eats home cooked meals only. He is drinking 4 bottles of water daily. He plays golf for exercise.   He has a family history of hypertension in his father's side of the family.   Wt Readings from Last 3 Encounters:  11/08/21 223 lb (101.2 kg)  03/07/21 215 lb 8 oz (97.8 kg)  12/12/20 221 lb (100.2 kg)     BP Readings from Last 3 Encounters:  11/08/21 (!) 146/88  06/05/21 (!) 158/106  03/07/21 (!) 138/100   2) Hyperlipidemia: He brings labs today that were collected by his employer a few weeks ago.  Triglyceride levels were 593, total cholesterol 290, LDL of 136.  He endorses a healthy diet, only home-cooked meals.  He does not participate in regular exercise given his hip.  He does play golf.    Review of Systems  Eyes:  Positive for visual disturbance.  Respiratory:  Negative for shortness of breath.   Cardiovascular:  Negative for chest pain.  Neurological:  Positive for headaches.         Past Medical History:  Diagnosis Date   Acute non-recurrent maxillary sinusitis 06/22/2019   Elevated blood pressure reading    GAD (generalized anxiety  disorder)    PVC (premature ventricular contraction)     Social History   Socioeconomic History   Marital status: Married    Spouse name: Not on file   Number of children: Not on file   Years of education: Not on file   Highest education level: Not on file  Occupational History   Not on file  Tobacco Use   Smoking status: Never   Smokeless tobacco: Never  Vaping Use   Vaping Use: Every Frank   Substances: Flavoring  Substance and Sexual Activity   Alcohol use: Yes    Comment: soical   Drug use: Yes    Types: Marijuana   Sexual activity: Yes    Birth control/protection: None  Other Topics Concern   Not on file  Social History Narrative   Engaged.   3 children.   Works for [employer].   Enjoys golfing.    Social Determinants of Health   Financial Resource Strain: Not on file  Food Insecurity: Not on file  Transportation Needs: Not on file  Physical Activity: Not on file  Stress: Not on file  Social Connections: Not on file  Intimate Partner Violence: Not on file    Past Surgical History:  Procedure Laterality Date   TONSILLECTOMY AND ADENOIDECTOMY  1999   TOTAL HIP ARTHROPLASTY Right 11/19/2020    Family History  Problem Relation Age of Onset   Renal cancer Father  No Known Allergies  Current Outpatient Medications on File Prior to Visit  Medication Sig Dispense Refill   venlafaxine XR (EFFEXOR-XR) 37.5 MG 24 hr capsule TAKE 1 CAPSULE BY MOUTH  DAILY WITH BREAKFAST FOR  ANXIETY (TAKE WITH 75MG   DOSE) 90 capsule 3   venlafaxine XR (EFFEXOR-XR) 75 MG 24 hr capsule TAKE 1 CAPSULE BY MOUTH  DAILY WITH BREAKFAST FOR  ANXIETY 90 capsule 3   No current facility-administered medications on file prior to visit.    BP (!) 146/88   Pulse (!) 109   Temp 98.6 F (37 C) (Oral)   Ht 5\' 11"  (1.803 m)   Wt 223 lb (101.2 kg)   SpO2 98%   BMI 31.10 kg/m  Objective:   Physical Exam Cardiovascular:     Rate and Rhythm: Normal rate and regular rhythm.   Pulmonary:     Effort: Pulmonary effort is normal.     Breath sounds: Normal breath sounds. No wheezing or rales.  Musculoskeletal:     Cervical back: Neck supple.  Skin:    General: Skin is warm and dry.  Neurological:     Mental Status: He is alert and oriented to person, place, and time.           Assessment & Plan:   Problem List Items Addressed This Visit       Cardiovascular and Mediastinum   Essential hypertension - Primary    Uncontrolled today, also with home readings and prior office visits.  Increase amlodipine to 10 mg daily. He will continue to monitor blood pressure at home.  We will plan to see him back in the office in 1 month.      Relevant Medications   amLODipine (NORVASC) 10 MG tablet     Other   Hyperlipidemia    Mostly high triglycerides.  Suspect part of this is hereditary, but we did discuss the importance of regular exercise and a healthy diet.  Start fish oil at 1000 mg twice daily with meals. Repeat lipid panel again during his next visit.      Relevant Medications   amLODipine (NORVASC) 10 MG tablet       , NP

## 2021-11-15 ENCOUNTER — Other Ambulatory Visit: Payer: Self-pay | Admitting: Primary Care

## 2021-11-15 DIAGNOSIS — F411 Generalized anxiety disorder: Secondary | ICD-10-CM

## 2021-12-06 ENCOUNTER — Other Ambulatory Visit: Payer: Self-pay | Admitting: Primary Care

## 2021-12-06 DIAGNOSIS — F411 Generalized anxiety disorder: Secondary | ICD-10-CM

## 2021-12-16 ENCOUNTER — Encounter: Payer: Self-pay | Admitting: Primary Care

## 2021-12-16 ENCOUNTER — Ambulatory Visit (INDEPENDENT_AMBULATORY_CARE_PROVIDER_SITE_OTHER): Payer: Managed Care, Other (non HMO) | Admitting: Primary Care

## 2021-12-16 VITALS — BP 126/74 | HR 67 | Temp 98.7°F | Ht 71.0 in | Wt 220.0 lb

## 2021-12-16 DIAGNOSIS — M1A9XX0 Chronic gout, unspecified, without tophus (tophi): Secondary | ICD-10-CM

## 2021-12-16 DIAGNOSIS — R7303 Prediabetes: Secondary | ICD-10-CM

## 2021-12-16 DIAGNOSIS — F411 Generalized anxiety disorder: Secondary | ICD-10-CM

## 2021-12-16 DIAGNOSIS — E782 Mixed hyperlipidemia: Secondary | ICD-10-CM

## 2021-12-16 DIAGNOSIS — Z0000 Encounter for general adult medical examination without abnormal findings: Secondary | ICD-10-CM | POA: Diagnosis not present

## 2021-12-16 DIAGNOSIS — Z23 Encounter for immunization: Secondary | ICD-10-CM | POA: Diagnosis not present

## 2021-12-16 DIAGNOSIS — I10 Essential (primary) hypertension: Secondary | ICD-10-CM | POA: Diagnosis not present

## 2021-12-16 NOTE — Addendum Note (Signed)
Addended by: Alvina Chou on: 12/16/2021 11:15 AM   Modules accepted: Orders

## 2021-12-16 NOTE — Assessment & Plan Note (Signed)
Discussed the importance of a healthy diet and regular exercise in order for weight loss, and to reduce the risk of further co-morbidity.  Repeat lipid panel pending. 

## 2021-12-16 NOTE — Assessment & Plan Note (Signed)
Immunizations UTD. Influenza vaccine provided today.  Discussed the importance of a healthy diet and regular exercise in order for weight loss, and to reduce the risk of further co-morbidity.  Exam stable. Labs pending.  Follow up in 1 year for repeat physical.  

## 2021-12-16 NOTE — Assessment & Plan Note (Signed)
Controlled.  Continue venlafaxine ER 112.5 mg daily.

## 2021-12-16 NOTE — Assessment & Plan Note (Signed)
Discussed the importance of a healthy diet and regular exercise in order for weight loss, and to reduce the risk of further co-morbidity. ? ?Repeat A1C pending. ?

## 2021-12-16 NOTE — Progress Notes (Signed)
Subjective:    Patient ID: Nathaniel Frank, male    DOB: 1985/07/31, 36 y.o.   MRN: [id number]  HPI  Nathaniel Frank is a very pleasant 36 y.o. male who presents today for complete physical and follow up of chronic conditions.  Immunizations: -Tetanus: 2019 -Influenza: Due today  -Covid-19: 2 vaccines   Diet: Fair diet.  Exercise: No regular exercise.  Eye exam: Completed several years ago Dental exam: Completed several years ago   BP Readings from Last 3 Encounters:  12/16/21 126/74  11/08/21 (!) 146/88  06/05/21 (!) 158/106     Review of Systems  Constitutional:  Negative for unexpected weight change.  HENT:  Negative for rhinorrhea.   Respiratory:  Negative for cough and shortness of breath.   Cardiovascular:  Positive for leg swelling. Negative for chest pain.       Ankle edema since dose increase of amlodipine to 10 mg daily  Gastrointestinal:  Negative for constipation and diarrhea.  Genitourinary:  Negative for difficulty urinating.  Musculoskeletal:  Negative for arthralgias and myalgias.  Skin:  Negative for rash.  Allergic/Immunologic: Negative for environmental allergies.  Neurological:  Negative for dizziness and headaches.  Psychiatric/Behavioral:  The patient is not nervous/anxious.          Past Medical History:  Diagnosis Date   Acute non-recurrent maxillary sinusitis 06/22/2019   Elevated blood pressure reading    GAD (generalized anxiety disorder)    PVC (premature ventricular contraction)     Social History   Socioeconomic History   Marital status: Married    Spouse name: Not on file   Number of children: Not on file   Years of education: Not on file   Highest education level: Not on file  Occupational History   Not on file  Tobacco Use   Smoking status: Never   Smokeless tobacco: Never  Vaping Use   Vaping Use: Every day   Substances: Flavoring  Substance and Sexual Activity   Alcohol use: Yes    Comment: soical   Drug use: Yes     Types: Marijuana   Sexual activity: Yes    Birth control/protection: None  Other Topics Concern   Not on file  Social History Narrative   Engaged.   3 children.   Works for [employer].   Enjoys golfing.    Social Determinants of Health   Financial Resource Strain: Not on file  Food Insecurity: Not on file  Transportation Needs: Not on file  Physical Activity: Not on file  Stress: Not on file  Social Connections: Not on file  Intimate Partner Violence: Not on file    Past Surgical History:  Procedure Laterality Date   TONSILLECTOMY AND ADENOIDECTOMY  1999   TOTAL HIP ARTHROPLASTY Right 11/19/2020    Family History  Problem Relation Age of Onset   Renal cancer Father     No Known Allergies  Current Outpatient Medications on File Prior to Visit  Medication Sig Dispense Refill   amLODipine (NORVASC) 10 MG tablet Take 1 tablet (10 mg total) by mouth daily. for blood pressure. 90 tablet 0   Colchicine 0.6 MG CAPS TAKE 1 CAPSULE BY MOUTH TWICE DAILY FOR 3 DAYS THEN TAKE 1 CAPSULE BY MOUTH DAILY FOR 3 DAYS     venlafaxine XR (EFFEXOR-XR) 37.5 MG 24 hr capsule Take 1 capsule (37.5 mg total) by mouth daily with breakfast. For anxiety. Take with 75 mg dose. Office visit required for further refills. 90 capsule 0  venlafaxine XR (EFFEXOR-XR) 75 MG 24 hr capsule Take 1 capsule (75 mg total) by mouth daily with breakfast. For anxiety. Office visit required for further refills. 90 capsule 0   No current facility-administered medications on file prior to visit.    BP 126/74   Pulse 67   Temp 98.7 F (37.1 C) (Oral)   Ht 5\' 11"  (1.803 m)   Wt 220 lb (99.8 kg)   SpO2 98%   BMI 30.68 kg/m  Objective:   Physical Exam HENT:     Right Ear: Tympanic membrane and ear canal normal.     Left Ear: Tympanic membrane and ear canal normal.     Nose: Nose normal.     Right Sinus: No maxillary sinus tenderness or frontal sinus tenderness.     Left Sinus: No maxillary sinus tenderness or  frontal sinus tenderness.  Eyes:     Conjunctiva/sclera: Conjunctivae normal.  Neck:     Thyroid: No thyromegaly.     Vascular: No carotid bruit.  Cardiovascular:     Rate and Rhythm: Normal rate and regular rhythm.     Heart sounds: Normal heart sounds.  Pulmonary:     Effort: Pulmonary effort is normal.     Breath sounds: Normal breath sounds. No wheezing or rales.  Abdominal:     General: Bowel sounds are normal.     Palpations: Abdomen is soft.     Tenderness: There is no abdominal tenderness.  Musculoskeletal:        General: Normal range of motion.     Cervical back: Neck supple.  Skin:    General: Skin is warm and dry.  Neurological:     Mental Status: He is alert and oriented to person, place, and time.     Cranial Nerves: No cranial nerve deficit.     Deep Tendon Reflexes: Reflexes are normal and symmetric.  Psychiatric:        Mood and Affect: Mood normal.           Assessment & Plan:   Problem List Items Addressed This Visit       Cardiovascular and Mediastinum   Essential hypertension    Improved and at goal, but with side effects of ankle edema.  Offered to change to losartan 50 mg, he kindly declines. His swelling is overall tolerable so he prefers to continue amlodipine 10 mg for now.  Continue amlodipine 10 mg daily.  He will update if edema becomes bothersome.       Relevant Orders   Comprehensive metabolic panel     Other   GAD (generalized anxiety disorder)    Controlled.  Continue venlafaxine ER 112.5 mg daily.      Preventative health care - Primary    Immunizations UTD. Influenza vaccine provided today.  Discussed the importance of a healthy diet and regular exercise in order for weight loss, and to reduce the risk of further co-morbidity.  Exam stable. Labs pending.  Follow up in 1 year for repeat physical.       Chronic gout    Controlled.  Repeat uric acid level pending. Continue colchicine 0.6 mg PRN.         Relevant Orders   Uric acid   Prediabetes    Discussed the importance of a healthy diet and regular exercise in order for weight loss, and to reduce the risk of further co-morbidity.  Repeat A1C pending.      Relevant Orders   Hemoglobin A1c  Hyperlipidemia    Discussed the importance of a healthy diet and regular exercise in order for weight loss, and to reduce the risk of further co-morbidity.  Repeat lipid panel pending.      Relevant Orders   Lipid panel   Other Visit Diagnoses     Need for immunization against influenza       Relevant Orders   Flu Vaccine QUAD 23mo+IM (Fluarix, Fluzone & Alfiuria Quad PF) (Completed)          Doreene Nest, NP

## 2021-12-16 NOTE — Patient Instructions (Signed)
Stop by the lab prior to leaving today. I will notify you of your results once received.   Try allergy eye drops such as olopatadine (Pataday).  It was a pleasure to see you today!  Preventive Care 69-39 Years Old, Male Preventive care refers to lifestyle choices and visits with your health care provider that can promote health and wellness. Preventive care visits are also called wellness exams. What can I expect for my preventive care visit? Counseling During your preventive care visit, your health care provider may ask about your: Medical history, including: Past medical problems. Family medical history. Current health, including: Emotional well-being. Home life and relationship well-being. Sexual activity. Lifestyle, including: Alcohol, nicotine or tobacco, and drug use. Access to firearms. Diet, exercise, and sleep habits. Safety issues such as seatbelt and bike helmet use. Sunscreen use. Work and work Astronomer. Physical exam Your health care provider may check your: Height and weight. These may be used to calculate your BMI (body mass index). BMI is a measurement that tells if you are at a healthy weight. Waist circumference. This measures the distance around your waistline. This measurement also tells if you are at a healthy weight and may help predict your risk of certain diseases, such as type 2 diabetes and high blood pressure. Heart rate and blood pressure. Body temperature. Skin for abnormal spots. What immunizations do I need?  Vaccines are usually given at various ages, according to a schedule. Your health care provider will recommend vaccines for you based on your age, medical history, and lifestyle or other factors, such as travel or where you work. What tests do I need? Screening Your health care provider may recommend screening tests for certain conditions. This may include: Lipid and cholesterol levels. Diabetes screening. This is done by checking your  blood sugar (glucose) after you have not eaten for a while (fasting). Hepatitis B test. Hepatitis C test. HIV (human immunodeficiency virus) test. STI (sexually transmitted infection) testing, if you are at risk. Talk with your health care provider about your test results, treatment options, and if necessary, the need for more tests. Follow these instructions at home: Eating and drinking  Eat a healthy diet that includes fresh fruits and vegetables, whole grains, lean protein, and low-fat dairy products. Drink enough fluid to keep your urine pale yellow. Take vitamin and mineral supplements as recommended by your health care provider. Do not drink alcohol if your health care provider tells you not to drink. If you drink alcohol: Limit how much you have to 0-2 drinks a day. Know how much alcohol is in your drink. In the [place] one drink equals one 12 oz bottle of beer (355 mL), one 5 oz glass of wine (148 mL), or one 1 oz glass of hard liquor (44 mL). Lifestyle Brush your teeth every morning and night with fluoride toothpaste. Floss one time each day. Exercise for at least 30 minutes 5 or more days each week. Do not use any products that contain nicotine or tobacco. These products include cigarettes, chewing tobacco, and vaping devices, such as e-cigarettes. If you need help quitting, ask your health care provider. Do not use drugs. If you are sexually active, practice safe sex. Use a condom or other form of protection to prevent STIs. Find healthy ways to manage stress, such as: Meditation, yoga, or listening to music. Journaling. Talking to a trusted person. Spending time with friends and family. Minimize exposure to UV radiation to reduce your risk of skin cancer. Safety Always  wear your seat belt while driving or riding in a vehicle. Do not drive: If you have been drinking alcohol. Do not ride with someone who has been drinking. If you have been using any mind-altering substances  or drugs. While texting. When you are tired or distracted. Wear a helmet and other protective equipment during sports activities. If you have firearms in your house, make sure you follow all gun safety procedures. Seek help if you have been physically or sexually abused. What's next? Go to your health care provider once a year for an annual wellness visit. Ask your health care provider how often you should have your eyes and teeth checked. Stay up to date on all vaccines. This information is not intended to replace advice given to you by your health care provider. Make sure you discuss any questions you have with your health care provider. Document Revised: 10/03/2020 Document Reviewed: 10/03/2020 Elsevier Patient Education  2023 [employer].

## 2021-12-16 NOTE — Assessment & Plan Note (Signed)
Controlled.  Repeat uric acid level pending. Continue colchicine 0.6 mg PRN.

## 2021-12-16 NOTE — Assessment & Plan Note (Signed)
Improved and at goal, but with side effects of ankle edema.  Offered to change to losartan 50 mg, he kindly declines. His swelling is overall tolerable so he prefers to continue amlodipine 10 mg for now.  Continue amlodipine 10 mg daily.  He will update if edema becomes bothersome.

## 2021-12-17 LAB — COMPREHENSIVE METABOLIC PANEL
ALT: 43 IU/L (ref 0–44)
AST: 29 IU/L (ref 0–40)
Albumin/Globulin Ratio: 2.1 (ref 1.2–2.2)
Albumin: 4.9 g/dL (ref 4.1–5.1)
Alkaline Phosphatase: 87 IU/L (ref 44–121)
BUN/Creatinine Ratio: 18 (ref 9–20)
BUN: 13 mg/dL (ref 6–20)
Bilirubin Total: 0.3 mg/dL (ref 0.0–1.2)
CO2: 23 mmol/L (ref 20–29)
Calcium: 9.3 mg/dL (ref 8.7–10.2)
Chloride: 99 mmol/L (ref 96–106)
Creatinine, Ser: 0.73 mg/dL — ABNORMAL LOW (ref 0.76–1.27)
Globulin, Total: 2.3 g/dL (ref 1.5–4.5)
Glucose: 139 mg/dL — ABNORMAL HIGH (ref 70–99)
Potassium: 4.2 mmol/L (ref 3.5–5.2)
Sodium: 141 mmol/L (ref 134–144)
Total Protein: 7.2 g/dL (ref 6.0–8.5)
eGFR: 119 mL/min/{1.73_m2} (ref >59)

## 2021-12-17 LAB — LIPID PANEL
Chol/HDL Ratio: 6.4 ratio — ABNORMAL HIGH (ref 0.0–5.0)
Cholesterol, Total: 300 mg/dL — ABNORMAL HIGH (ref 100–199)
HDL: 47 mg/dL (ref >39)
LDL Chol Calc (NIH): 168 mg/dL — ABNORMAL HIGH (ref 0–99)
Triglycerides: 437 mg/dL — ABNORMAL HIGH (ref 0–149)
VLDL Cholesterol Cal: 85 mg/dL — ABNORMAL HIGH (ref 5–40)

## 2021-12-17 LAB — HEMOGLOBIN A1C
Est. average glucose Bld gHb Est-mCnc: 137 mg/dL
Hgb A1c MFr Bld: 6.4 % — ABNORMAL HIGH (ref 4.8–5.6)

## 2021-12-17 LAB — URIC ACID: Uric Acid: 8.3 mg/dL (ref 3.8–8.4)

## 2021-12-18 DIAGNOSIS — R7303 Prediabetes: Secondary | ICD-10-CM

## 2021-12-18 DIAGNOSIS — E782 Mixed hyperlipidemia: Secondary | ICD-10-CM

## 2021-12-18 LAB — LIPOPROTEIN A (LPA): Lipoprotein (a): 135.4 nmol/L — ABNORMAL HIGH (ref <75.0)

## 2021-12-18 LAB — SPECIMEN STATUS REPORT

## 2021-12-18 MED ORDER — ATORVASTATIN CALCIUM 20 MG PO TABS: 20.0000 mg | ORAL_TABLET | Freq: Every day | ORAL | 1 refills | Status: DC

## 2021-12-18 NOTE — Telephone Encounter (Signed)
Patient response regarding labs. He is aware you are out of office per your message.

## 2022-01-09 ENCOUNTER — Telehealth: Payer: Self-pay | Admitting: Primary Care

## 2022-01-09 NOTE — Telephone Encounter (Signed)
Great question!  Hold the cholesterol medication while taking colchicine.  Resume once flare resolves.

## 2022-01-09 NOTE — Telephone Encounter (Signed)
Patient called in stating that he's having a gout flare up,and he would like to know if it would be okay if he takes the colchicine with the lipitor,because he read that he shouldn't take them both when he has a flare up? Please advise.

## 2022-01-09 NOTE — Telephone Encounter (Signed)
Patient called back in and relayed message below.  

## 2022-01-13 ENCOUNTER — Other Ambulatory Visit: Payer: Self-pay | Admitting: Primary Care

## 2022-01-13 DIAGNOSIS — I10 Essential (primary) hypertension: Secondary | ICD-10-CM

## 2022-01-13 NOTE — Telephone Encounter (Signed)
See open refill request for documentation.

## 2022-01-13 NOTE — Telephone Encounter (Signed)
Unable to reach patient. Left voicemail to return call to our office.   

## 2022-01-13 NOTE — Telephone Encounter (Signed)
Patient called back and verified, taking 10 mg. Refill not needed.

## 2022-01-13 NOTE — Telephone Encounter (Signed)
Patient called to return a call.  

## 2022-02-04 ENCOUNTER — Other Ambulatory Visit: Payer: Self-pay | Admitting: Primary Care

## 2022-02-04 DIAGNOSIS — F411 Generalized anxiety disorder: Secondary | ICD-10-CM

## 2022-02-05 ENCOUNTER — Other Ambulatory Visit: Payer: Self-pay | Admitting: Primary Care

## 2022-02-05 DIAGNOSIS — I10 Essential (primary) hypertension: Secondary | ICD-10-CM

## 2022-02-11 DIAGNOSIS — E782 Mixed hyperlipidemia: Secondary | ICD-10-CM

## 2022-02-11 DIAGNOSIS — I10 Essential (primary) hypertension: Secondary | ICD-10-CM

## 2022-02-13 MED ORDER — PRAVASTATIN SODIUM 40 MG PO TABS: 40.0000 mg | ORAL_TABLET | Freq: Every day | ORAL | 0 refills | Status: DC

## 2022-02-20 MED ORDER — LOSARTAN POTASSIUM 50 MG PO TABS: 50.0000 mg | ORAL_TABLET | Freq: Every day | ORAL | 0 refills | Status: DC

## 2022-02-24 ENCOUNTER — Other Ambulatory Visit: Payer: Self-pay | Admitting: Primary Care

## 2022-02-24 DIAGNOSIS — F411 Generalized anxiety disorder: Secondary | ICD-10-CM

## 2022-03-10 ENCOUNTER — Other Ambulatory Visit: Payer: Self-pay | Admitting: Primary Care

## 2022-03-10 ENCOUNTER — Ambulatory Visit: Payer: Managed Care, Other (non HMO) | Admitting: Primary Care

## 2022-03-10 ENCOUNTER — Encounter: Payer: Self-pay | Admitting: Primary Care

## 2022-03-10 VITALS — BP 152/102 | HR 105 | Temp 97.3°F | Ht 71.0 in | Wt 217.0 lb

## 2022-03-10 DIAGNOSIS — K219 Gastro-esophageal reflux disease without esophagitis: Secondary | ICD-10-CM

## 2022-03-10 DIAGNOSIS — I10 Essential (primary) hypertension: Secondary | ICD-10-CM

## 2022-03-10 DIAGNOSIS — E782 Mixed hyperlipidemia: Secondary | ICD-10-CM

## 2022-03-10 MED ORDER — LOSARTAN POTASSIUM-HCTZ 50-12.5 MG PO TABS: 1.0000 | ORAL_TABLET | Freq: Every day | ORAL | 0 refills | Status: DC

## 2022-03-10 MED ORDER — OMEPRAZOLE 40 MG PO CPDR: 40.0000 mg | DELAYED_RELEASE_CAPSULE | Freq: Every day | ORAL | 0 refills | Status: DC

## 2022-03-10 NOTE — Assessment & Plan Note (Signed)
Symptoms of chronic cough and esophageal burning represent GERD.   Discouraged consumption of beer/alcohol prior to bedtime.  Discussed other triggers for GERD.  Start omeprazole 40 mg HS x 30 days, then try to wean down to 20 mg daily, then an eventual wean off.  Follow up in 2 weeks.

## 2022-03-10 NOTE — Progress Notes (Signed)
Subjective:    Patient ID: Nathaniel Frank, male    DOB: 12-31-1985, 36 y.o.   MRN: [id number]  Hypertension Pertinent negatives include no chest pain, headaches or shortness of breath.    Nathaniel Frank is a very pleasant 36 y.o. male with a history of hypertension, GAD, chronic gout, hyperlipidemia who presents today for follow up of hypertension and hyperlipidemia.  1) Hypertension: He was last evaluated on 12/16/21 for his routine physical and BP follow up. Blood pressure had improved with amlodipine 10 mg, but he was experiencing side effects of ankle edema. At the time he decided to continue amlodipine 10 mg. Shortly after his visit he changed his mind and amlodipine was discontinued and losartan 50 mg was initiated. He is here for follow up and BMP today.  He is compliant to losartan 50 mg daily. He has not checked BP at home. He has noticed some blurred vision. He denies headaches. His ankle edema has resolved.   BP Readings from Last 3 Encounters:  03/10/22 (!) 152/102  12/16/21 126/74  11/08/21 (!) 146/88    2) Hyperlipidemia: Chronic history. Last lipid panel was in August 2023 with LDL of 168. Historically with elevated LDL in the 160's-170's. Lipoprotein A lab completed which revealed a level of 135. He is currently managed on pravastatin 40 mg, initiated on 02/20/22. He experienced increased gout flares on Lipitor.   He is taking pravastatin daily, has been doing so for the last 3 weeks. He denies gout flares.   3) Chronic Cough: Chronic since February 2023 after an acute illness, worse over the last few weeks. Cough occurs every morning when waking. He will sometimes cough so violently he will have to vomit. He's also noticed increase belching and esophageal burning. His cough is worse in the morning, but he does experience a dry cough during the day/evening.   He took a few doses of famotidine a few weeks ago before dinner with some reduction of esophageal burning. He has cut  back on spicy foods. He does drink 2 beers before bedtime.   Review of Systems  Respiratory:  Negative for shortness of breath.   Cardiovascular:  Negative for chest pain.  Gastrointestinal:        GERD  Neurological:  Negative for dizziness and headaches.         Past Medical History:  Diagnosis Date   Acute non-recurrent maxillary sinusitis 06/22/2019   Elevated blood pressure reading    GAD (generalized anxiety disorder)    PVC (premature ventricular contraction)     Social History   Socioeconomic History   Marital status: Married    Spouse name: Not on file   Number of children: Not on file   Years of education: Not on file   Highest education level: Not on file  Occupational History   Not on file  Tobacco Use   Smoking status: Never   Smokeless tobacco: Never  Vaping Use   Vaping Use: Every day   Substances: Flavoring  Substance and Sexual Activity   Alcohol use: Yes    Comment: soical   Drug use: Yes    Types: Marijuana   Sexual activity: Yes    Birth control/protection: None  Other Topics Concern   Not on file  Social History Narrative   Engaged.   3 children.   Works for [employer].   Enjoys golfing.    Social Determinants of Health   Financial Resource Strain: Not on file  Food Insecurity:  Not on file  Transportation Needs: Not on file  Physical Activity: Not on file  Stress: Not on file  Social Connections: Not on file  Intimate Partner Violence: Not on file    Past Surgical History:  Procedure Laterality Date   TONSILLECTOMY AND ADENOIDECTOMY  1999   TOTAL HIP ARTHROPLASTY Right 11/19/2020    Family History  Problem Relation Age of Onset   Renal cancer Father     No Known Allergies  Current Outpatient Medications on File Prior to Visit  Medication Sig Dispense Refill   Colchicine 0.6 MG CAPS TAKE 1 CAPSULE BY MOUTH TWICE DAILY FOR 3 DAYS THEN TAKE 1 CAPSULE BY MOUTH DAILY FOR 3 DAYS     pravastatin (PRAVACHOL) 40 MG tablet Take 1  tablet (40 mg total) by mouth daily. for cholesterol. 90 tablet 0   venlafaxine XR (EFFEXOR-XR) 37.5 MG 24 hr capsule TAKE 1 CAPSULE BY MOUTH DAILY  WITH BREAKFAST FOR ANXIETY. TAKE WITH THE 75MG  CAPSULE. 90 capsule 2   venlafaxine XR (EFFEXOR-XR) 75 MG 24 hr capsule TAKE 1 CAPSULE BY MOUTH DAILY  WITH BREAKFAST FOR ANXIETY 90 capsule 2   No current facility-administered medications on file prior to visit.    BP (!) 152/102   Pulse (!) 105   Temp (!) 97.3 F (36.3 C) (Temporal)   Ht 5\' 11"  (1.803 m)   Wt 217 lb (98.4 kg)   SpO2 98%   BMI 30.27 kg/m  Objective:   Physical Exam Cardiovascular:     Rate and Rhythm: Normal rate and regular rhythm.  Pulmonary:     Effort: Pulmonary effort is normal.  Musculoskeletal:     Cervical back: Neck supple.  Skin:    General: Skin is warm and dry.  Neurological:     Mental Status: He is alert and oriented to person, place, and time.           Assessment & Plan:   Problem List Items Addressed This Visit       Cardiovascular and Mediastinum   Essential hypertension - Primary    Uncontrolled.  Stop losartan 50 mg. Start losartan-HCTZ 50-12.5 mg daily. New Rx sent to pharmacy.  We will plan to see him back in 2 weeks for BP check and BMP.      Relevant Medications   losartan-hydrochlorothiazide (HYZAAR) 50-12.5 MG tablet     Digestive   Gastroesophageal reflux disease    Symptoms of chronic cough and esophageal burning represent GERD.   Discouraged consumption of beer/alcohol prior to bedtime.  Discussed other triggers for GERD.  Start omeprazole 40 mg HS x 30 days, then try to wean down to 20 mg daily, then an eventual wean off.  Follow up in 2 weeks.       Relevant Medications   omeprazole (PRILOSEC) 40 MG capsule     Other   Hyperlipidemia    Continue pravastatin 40 mg daily. Repeat lipids next visit.        Relevant Medications   losartan-hydrochlorothiazide (HYZAAR) 50-12.5 MG tablet        , NP

## 2022-03-10 NOTE — Assessment & Plan Note (Signed)
Continue pravastatin 40 mg daily. Repeat lipids next visit.

## 2022-03-10 NOTE — Assessment & Plan Note (Signed)
Uncontrolled.  Stop losartan 50 mg. Start losartan-HCTZ 50-12.5 mg daily. New Rx sent to pharmacy.  We will plan to see him back in 2 weeks for BP check and BMP.

## 2022-03-24 ENCOUNTER — Other Ambulatory Visit: Payer: Self-pay | Admitting: Primary Care

## 2022-03-24 DIAGNOSIS — I10 Essential (primary) hypertension: Secondary | ICD-10-CM

## 2022-03-26 ENCOUNTER — Ambulatory Visit: Payer: Managed Care, Other (non HMO) | Admitting: Primary Care

## 2022-04-02 ENCOUNTER — Ambulatory Visit: Payer: Managed Care, Other (non HMO) | Admitting: Primary Care

## 2022-04-07 ENCOUNTER — Other Ambulatory Visit: Payer: Self-pay | Admitting: Primary Care

## 2022-04-07 DIAGNOSIS — K219 Gastro-esophageal reflux disease without esophagitis: Secondary | ICD-10-CM

## 2022-04-07 DIAGNOSIS — I10 Essential (primary) hypertension: Secondary | ICD-10-CM

## 2022-04-10 ENCOUNTER — Encounter: Payer: Self-pay | Admitting: Primary Care

## 2022-04-10 ENCOUNTER — Ambulatory Visit: Payer: Managed Care, Other (non HMO) | Admitting: Primary Care

## 2022-04-10 VITALS — BP 152/102 | HR 90 | Temp 97.3°F | Ht 71.0 in | Wt 220.0 lb

## 2022-04-10 DIAGNOSIS — Z201 Contact with and (suspected) exposure to tuberculosis: Secondary | ICD-10-CM | POA: Diagnosis not present

## 2022-04-10 DIAGNOSIS — E782 Mixed hyperlipidemia: Secondary | ICD-10-CM | POA: Diagnosis not present

## 2022-04-10 DIAGNOSIS — I10 Essential (primary) hypertension: Secondary | ICD-10-CM

## 2022-04-10 DIAGNOSIS — Z125 Encounter for screening for malignant neoplasm of prostate: Secondary | ICD-10-CM | POA: Diagnosis not present

## 2022-04-10 MED ORDER — LOSARTAN POTASSIUM-HCTZ 100-25 MG PO TABS: 1.0000 | ORAL_TABLET | Freq: Every day | ORAL | 0 refills | Status: DC

## 2022-04-10 NOTE — Assessment & Plan Note (Signed)
Uncontrolled.  Increase losartan-HCTZ to 100-25 mg daily. Checking renal function and electrolytes today.  I have asked for him to send me BP readings via MyChart in 2 weeks.

## 2022-04-10 NOTE — Patient Instructions (Signed)
We increased your blood pressure medication to losartan-HCTZ 100-25 mg once daily.   Stop by the lab prior to leaving today. I will notify you of your results once received.   Please send me blood pressure readings in 2 weeks via MyChart.   It was a pleasure to see you today!

## 2022-04-10 NOTE — Progress Notes (Signed)
Subjective:    Patient ID: Nathaniel Frank, male    DOB: 1985/08/29, 36 y.o.   MRN: [id number]  HPI  Nathaniel Frank is a very pleasant 36 y.o. male with a history of hypertension, GAD, chronic gout, prediabetes, hyperlipidemia who presents today for follow up of hypertension and hyperlipidemia.   1) Hypertension: Currently managed on losartan 50-12.5 mg daily which was initiated in late November 2023 for uncontrolled BP readings despite management on losartan 50 mg.  Since his last visit he has been taking losartan-HCTZ 50-12.5 mg daily. He does have an occasional throbbing to the head. He denies dizziness, chest pain. He does not check BP at home but he does have a cuff.   BP Readings from Last 3 Encounters:  04/10/22 (!) 152/102  03/10/22 (!) 152/102  12/16/21 126/74     2) Hyperlipidemia: Currently managed on pravastatin 40 mg daily which was initiated in August due to ongoing hyperlipidemia with LDL ranging 160's-170's with lipoprotein A level of 135.   He is due for repeat lipid panel.  3) TB Exposure: The CDC recently evaluated his work and found that the mycobacterium grown for data analysis was not in a negative pressure room. He denies unexpected weight loss, cough, body aches, hemoptysis. He is needing TB testing today.   4) Elevated PSA: In Father around November 2023. He does not recall his father's level, but his father's level was repeated and it was lower. He would like his PSA level checked.  He denies urinary urgency, frequency, nocturia.   Review of Systems  Constitutional:  Negative for unexpected weight change.  HENT:  Negative for congestion.   Respiratory:  Negative for cough and shortness of breath.   Cardiovascular:  Negative for chest pain.  Neurological:  Positive for headaches. Negative for dizziness.         Past Medical History:  Diagnosis Date   Acute non-recurrent maxillary sinusitis 06/22/2019   Elevated blood pressure reading    GAD (generalized  anxiety disorder)    PVC (premature ventricular contraction)     Social History   Socioeconomic History   Marital status: Married    Spouse name: Not on file   Number of children: Not on file   Years of education: Not on file   Highest education level: Not on file  Occupational History   Not on file  Tobacco Use   Smoking status: Never   Smokeless tobacco: Never  Vaping Use   Vaping Use: Every day   Substances: Flavoring  Substance and Sexual Activity   Alcohol use: Yes    Comment: soical   Drug use: Yes    Types: Marijuana   Sexual activity: Yes    Birth control/protection: None  Other Topics Concern   Not on file  Social History Narrative   Engaged.   3 children.   Works for [employer].   Enjoys golfing.    Social Determinants of Health   Financial Resource Strain: Not on file  Food Insecurity: Not on file  Transportation Needs: Not on file  Physical Activity: Not on file  Stress: Not on file  Social Connections: Not on file  Intimate Partner Violence: Not on file    Past Surgical History:  Procedure Laterality Date   TONSILLECTOMY AND ADENOIDECTOMY  1999   TOTAL HIP ARTHROPLASTY Right 11/19/2020    Family History  Problem Relation Age of Onset   Renal cancer Father     No Known Allergies  Current Outpatient  Medications on File Prior to Visit  Medication Sig Dispense Refill   Colchicine 0.6 MG CAPS TAKE 1 CAPSULE BY MOUTH TWICE DAILY FOR 3 DAYS THEN TAKE 1 CAPSULE BY MOUTH DAILY FOR 3 DAYS     omeprazole (PRILOSEC) 40 MG capsule TAKE 1 CAPSULE(40 MG) BY MOUTH AT BEDTIME FOR HEARTBURN 30 capsule 0   pravastatin (PRAVACHOL) 40 MG tablet Take 1 tablet (40 mg total) by mouth daily. for cholesterol. 90 tablet 0   venlafaxine XR (EFFEXOR-XR) 37.5 MG 24 hr capsule TAKE 1 CAPSULE BY MOUTH DAILY  WITH BREAKFAST FOR ANXIETY. TAKE WITH THE 75MG  CAPSULE. 90 capsule 2   venlafaxine XR (EFFEXOR-XR) 75 MG 24 hr capsule TAKE 1 CAPSULE BY MOUTH DAILY  WITH BREAKFAST  FOR ANXIETY 90 capsule 2   No current facility-administered medications on file prior to visit.    BP (!) 152/102   Pulse 90   Temp (!) 97.3 F (36.3 C) (Temporal)   Ht 5\' 11"  (1.803 m)   Wt 220 lb (99.8 kg)   SpO2 98%   BMI 30.68 kg/m  Objective:   Physical Exam Cardiovascular:     Rate and Rhythm: Normal rate and regular rhythm.  Pulmonary:     Effort: Pulmonary effort is normal.     Breath sounds: Normal breath sounds. No wheezing or rales.  Musculoskeletal:     Cervical back: Neck supple.  Skin:    General: Skin is warm and dry.  Neurological:     Mental Status: He is alert and oriented to person, place, and time.           Assessment & Plan:   Problem List Items Addressed This Visit       Cardiovascular and Mediastinum   Essential hypertension - Primary    Uncontrolled.  Increase losartan-HCTZ to 100-25 mg daily. Checking renal function and electrolytes today.  I have asked for him to send me BP readings via MyChart in 2 weeks.      Relevant Medications   losartan-hydrochlorothiazide (HYZAAR) 100-25 MG tablet     Other   Hyperlipidemia    Repeat lipid panel pending.  Continue pravastatin 40 mg daily.      Relevant Medications   losartan-hydrochlorothiazide (HYZAAR) 100-25 MG tablet   Other Relevant Orders   Comprehensive metabolic panel   Lipid panel   Exposure to Mycobacterium tuberculosis    At work. Asymptomatic.   Quantiferon TB testing ordered and pending.      Relevant Orders   QuantiFERON-TB Gold Plus   Other Visit Diagnoses     Screening for prostate cancer       Relevant Orders   PSA          , NP

## 2022-04-10 NOTE — Assessment & Plan Note (Signed)
Repeat lipid panel pending. Continue pravastatin 40 mg daily. 

## 2022-04-10 NOTE — Assessment & Plan Note (Signed)
At work. Asymptomatic.   Quantiferon TB testing ordered and pending.

## 2022-04-16 ENCOUNTER — Other Ambulatory Visit: Payer: Self-pay | Admitting: Primary Care

## 2022-04-16 DIAGNOSIS — E782 Mixed hyperlipidemia: Secondary | ICD-10-CM

## 2022-04-19 LAB — LIPID PANEL
Chol/HDL Ratio: 10.2 ratio — ABNORMAL HIGH (ref 0.0–5.0)
Cholesterol, Total: 286 mg/dL — ABNORMAL HIGH (ref 100–199)
HDL: 28 mg/dL — ABNORMAL LOW (ref >39)
Triglycerides: 1578 mg/dL (ref 0–149)

## 2022-04-19 LAB — COMPREHENSIVE METABOLIC PANEL
ALT: 34 IU/L (ref 0–44)
AST: 40 IU/L (ref 0–40)
Albumin/Globulin Ratio: 1.9 (ref 1.2–2.2)
Albumin: 4.3 g/dL (ref 4.1–5.1)
Alkaline Phosphatase: 77 IU/L (ref 44–121)
BUN/Creatinine Ratio: 17 (ref 9–20)
BUN: 15 mg/dL (ref 6–20)
Bilirubin Total: 0.5 mg/dL (ref 0.0–1.2)
CO2: 23 mmol/L (ref 20–29)
Calcium: 9.4 mg/dL (ref 8.7–10.2)
Chloride: 99 mmol/L (ref 96–106)
Creatinine, Ser: 0.88 mg/dL (ref 0.76–1.27)
Globulin, Total: 2.3 g/dL (ref 1.5–4.5)
Glucose: 138 mg/dL — ABNORMAL HIGH (ref 70–99)
Potassium: 4.3 mmol/L (ref 3.5–5.2)
Sodium: 138 mmol/L (ref 134–144)
Total Protein: 6.6 g/dL (ref 6.0–8.5)
eGFR: 113 mL/min/{1.73_m2} (ref >59)

## 2022-04-19 LAB — QUANTIFERON-TB GOLD PLUS
QuantiFERON Mitogen Value: >10 IU/mL
QuantiFERON Nil Value: 0.06 IU/mL
QuantiFERON TB1 Ag Value: 0.08 IU/mL
QuantiFERON TB2 Ag Value: 0.07 IU/mL
QuantiFERON-TB Gold Plus: NEGATIVE

## 2022-04-19 LAB — PSA: Prostate Specific Ag, Serum: 0.2 ng/mL (ref 0.0–4.0)

## 2022-05-07 DIAGNOSIS — I10 Essential (primary) hypertension: Secondary | ICD-10-CM

## 2022-05-07 DIAGNOSIS — E782 Mixed hyperlipidemia: Secondary | ICD-10-CM

## 2022-05-08 ENCOUNTER — Other Ambulatory Visit: Payer: Self-pay | Admitting: Primary Care

## 2022-05-08 DIAGNOSIS — I10 Essential (primary) hypertension: Secondary | ICD-10-CM

## 2022-05-08 DIAGNOSIS — K219 Gastro-esophageal reflux disease without esophagitis: Secondary | ICD-10-CM

## 2022-05-08 MED ORDER — VALSARTAN-HYDROCHLOROTHIAZIDE 160-12.5 MG PO TABS: 1.0000 | ORAL_TABLET | Freq: Every day | ORAL | 0 refills | Status: DC

## 2022-05-14 DIAGNOSIS — R053 Chronic cough: Secondary | ICD-10-CM

## 2022-05-14 DIAGNOSIS — I10 Essential (primary) hypertension: Secondary | ICD-10-CM

## 2022-05-14 DIAGNOSIS — K219 Gastro-esophageal reflux disease without esophagitis: Secondary | ICD-10-CM

## 2022-05-14 NOTE — Telephone Encounter (Signed)
We need to regroup. Can you set him up for an in person or virtual visit to discuss?

## 2022-05-15 ENCOUNTER — Telehealth (INDEPENDENT_AMBULATORY_CARE_PROVIDER_SITE_OTHER): Payer: Managed Care, Other (non HMO) | Admitting: Primary Care

## 2022-05-15 ENCOUNTER — Encounter: Payer: Self-pay | Admitting: Primary Care

## 2022-05-15 DIAGNOSIS — R053 Chronic cough: Secondary | ICD-10-CM

## 2022-05-15 MED ORDER — PULMICORT FLEXHALER 90 MCG/ACT IN AEPB: 1.0000 | INHALATION_SPRAY | Freq: Two times a day (BID) | RESPIRATORY_TRACT | 0 refills | Status: DC

## 2022-05-15 NOTE — Progress Notes (Signed)
Patient ID: Gunnar Hereford, male    DOB: 03/25/1986, 37 y.o.   MRN: [id number]  Virtual visit completed through [facility], a video enabled telemedicine application. Due to national recommendations of social distancing due to COVID-19, a virtual visit is felt to be most appropriate for this patient at this time. Reviewed limitations, risks, security and privacy concerns of performing a virtual visit and the availability of in person appointments. I also reviewed that there may be a patient responsible charge related to this service. The patient agreed to proceed.   Patient location: home Provider location: [place] [facility] [place], office Persons participating in this virtual visit: patient, provider   If any vitals were documented, they were collected by patient at home unless specified below.    There were no vitals taken for this visit.   CC: Chronic cough, GERD Subjective:   HPI: Colter Magowan is a 37 y.o. male presenting on 05/15/2022 for Medical Management of Chronic Issues (Deep cough and vomiting daily, has not seen improvement with antacids. )  His cough began around February 2023 after an acute illness, has waxed and waned since. He will cough so violently at times that he will vomit.   He's also struggled with intermittent GERD with vomiting. He was last evaluated for this in November 2023 and during this visit he mentioned drinking 2 beers each evening before bed. He had noticed some improvement with PRN famotidine. We discussed to refrain from alcohol use before bedtime and treated his symptoms with omeprazole 40 mg daily.   Since his visit in November 2023 he contracted Covid-19 infection twice in December 2023. Since then his cough has progressed which is deep, non productive, and results in daily vomiting. He has vomited with coughing every day for the last 2 months.   He found improvement in his GERD symptoms while taking omeprazole 40 mg daily, has not taken in two days, but  he found no improvement in his cough. He does have omeprazole 20 mg at home.  He was prescribed an albuterol inhaler 2 months ago via [facility] visit for Covid-19 infection, used it for about 1 week and found no improvement in his cough.  He denies a prior history of asthma, even as a child. He had no issues with breathing or wheezing while in the [employer].      Relevant past medical, surgical, family and social history reviewed and updated as indicated. Interim medical history since our last visit reviewed. Allergies and medications reviewed and updated. Outpatient Medications Prior to Visit  Medication Sig Dispense Refill   Colchicine 0.6 MG CAPS TAKE 1 CAPSULE BY MOUTH TWICE DAILY FOR 3 DAYS THEN TAKE 1 CAPSULE BY MOUTH DAILY FOR 3 DAYS     omeprazole (PRILOSEC OTC) 20 MG tablet Take 20 mg by mouth daily.     pravastatin (PRAVACHOL) 40 MG tablet TAKE 1 TABLET BY MOUTH DAILY FOR CHOLESTEROL 90 tablet 3   valsartan-hydrochlorothiazide (DIOVAN-HCT) 160-12.5 MG tablet Take 1 tablet by mouth daily. for blood pressure. 90 tablet 0   venlafaxine XR (EFFEXOR-XR) 37.5 MG 24 hr capsule TAKE 1 CAPSULE BY MOUTH DAILY  WITH BREAKFAST FOR ANXIETY. TAKE WITH THE 75MG  CAPSULE. 90 capsule 2   venlafaxine XR (EFFEXOR-XR) 75 MG 24 hr capsule TAKE 1 CAPSULE BY MOUTH DAILY  WITH BREAKFAST FOR ANXIETY 90 capsule 2   omeprazole (PRILOSEC) 40 MG capsule TAKE 1 CAPSULE(40 MG) BY MOUTH AT BEDTIME FOR HEARTBURN (Patient not taking: Reported on 05/15/2022) 30  capsule 0   No facility-administered medications prior to visit.     Per HPI unless specifically indicated in ROS section below Review of Systems  HENT:  Positive for rhinorrhea.   Respiratory:  Positive for cough.    Objective:  There were no vitals taken for this visit.  Wt Readings from Last 3 Encounters:  04/10/22 220 lb (99.8 kg)  03/10/22 217 lb (98.4 kg)  12/16/21 220 lb (99.8 kg)       Physical exam: General: Alert and oriented x 3, no distress,  does not appear sickly  Pulmonary: Speaks in complete sentences without increased work of breathing, no cough during visit.  Psychiatric: Normal mood, thought content, and behavior.     Results for orders placed or performed in visit on 04/10/22  Comprehensive metabolic panel  Result Value Ref Range   Glucose 138 (H) 70 - 99 mg/dL   BUN 15 6 - 20 mg/dL   Creatinine, Ser 0.88 0.76 - 1.27 mg/dL   eGFR 113 >59 mL/min/1.73   BUN/Creatinine Ratio 17 9 - 20   Sodium 138 134 - 144 mmol/L   Potassium 4.3 3.5 - 5.2 mmol/L   Chloride 99 96 - 106 mmol/L   CO2 23 20 - 29 mmol/L   Calcium 9.4 8.7 - 10.2 mg/dL   Total Protein 6.6 6.0 - 8.5 g/dL   Albumin 4.3 4.1 - 5.1 g/dL   Globulin, Total 2.3 1.5 - 4.5 g/dL   Albumin/Globulin Ratio 1.9 1.2 - 2.2   Bilirubin Total 0.5 0.0 - 1.2 mg/dL   Alkaline Phosphatase 77 44 - 121 IU/L   AST 40 0 - 40 IU/L   ALT 34 0 - 44 IU/L  PSA  Result Value Ref Range   Prostate Specific Ag, Serum 0.2 0.0 - 4.0 ng/mL  Lipid panel  Result Value Ref Range   Cholesterol, Total 286 (H) 100 - 199 mg/dL   Triglycerides 1,578 (HH) 0 - 149 mg/dL   HDL 28 (L) >39 mg/dL   VLDL Cholesterol Cal Comment (A) 5 - 40 mg/dL   LDL Chol Calc (NIH) Comment (A) 0 - 99 mg/dL   Chol/HDL Ratio 10.2 (H) 0.0 - 5.0 ratio  QuantiFERON-TB Gold Plus  Result Value Ref Range   QuantiFERON Incubation Incubation performed.    QuantiFERON Criteria Comment    QuantiFERON TB1 Ag Value 0.08 IU/mL   QuantiFERON TB2 Ag Value 0.07 IU/mL   QuantiFERON Nil Value 0.06 IU/mL   QuantiFERON Mitogen Value >10.00 IU/mL   QuantiFERON-TB Gold Plus Negative Negative   Assessment & Plan:   Problem List Items Addressed This Visit       Other   Persistent cough for 3 weeks or longer - Primary    Unclear cause, suspecting reactive airway disease, especially given recent URI. Doesn't appear to be GERD etiology, but GERD could be contributing. He is managed on ARB, less likely the cause but will keep in  differentials.   Resume omeprazole at 20 mg daily. Start ICS inhaler, Pulmicort, 1-2 puffs BID.  Consider ENT vs pulm referral if warranted.   He will update via MyChart in 1 week.       Relevant Medications   Budesonide (PULMICORT FLEXHALER) 90 MCG/ACT inhaler     Meds ordered this encounter  Medications   Budesonide (PULMICORT FLEXHALER) 90 MCG/ACT inhaler    Sig: Inhale 1-2 puffs into the lungs 2 (two) times daily. Rinse mouth after each use.    Dispense:  1 each  Refill:  0    Order Specific Question:   Supervising Provider    Answer:   BEDSOLE, AMY E [[id number]]   No orders of the defined types were placed in this encounter.   I discussed the assessment and treatment plan with the patient. The patient was provided an opportunity to ask questions and all were answered. The patient agreed with the plan and demonstrated an understanding of the instructions. The patient was advised to call back or seek an in-person evaluation if the symptoms worsen or if the condition fails to improve as anticipated.  Follow up plan:  Start pulmicort inhaler for cough. Inhale 1-2 puffs into the lungs twice daily. Rinse mouth after each use.  Resume omperazole at 20 mg daily.  Please update me next week.  It was a pleasure to see you today!    Pleas Koch, NP

## 2022-05-15 NOTE — Assessment & Plan Note (Addendum)
Unclear cause, suspecting reactive airway disease, especially given recent URI. Doesn't appear to be GERD etiology, but GERD could be contributing. He is managed on ARB, less likely the cause but will keep in differentials.   Resume omeprazole at 20 mg daily. Start ICS inhaler, Pulmicort, 1-2 puffs BID.  Consider ENT vs pulm referral if warranted.   He will update via MyChart in 1 week.

## 2022-05-15 NOTE — Patient Instructions (Signed)
Start pulmicort inhaler for cough. Inhale 1-2 puffs into the lungs twice daily. Rinse mouth after each use.  Resume omperazole at 20 mg daily.  Please update me next week.  It was a pleasure to see you today!

## 2022-05-15 NOTE — Telephone Encounter (Signed)
Called left patient a message to call back and schedule an in person or virtual visit.

## 2022-05-20 ENCOUNTER — Other Ambulatory Visit: Payer: Managed Care, Other (non HMO)

## 2022-05-21 MED ORDER — OMEPRAZOLE 20 MG PO CPDR: 20.0000 mg | DELAYED_RELEASE_CAPSULE | Freq: Every day | ORAL | 0 refills | Status: DC

## 2022-05-21 MED ORDER — PULMICORT FLEXHALER 90 MCG/ACT IN AEPB: 1.0000 | INHALATION_SPRAY | Freq: Two times a day (BID) | RESPIRATORY_TRACT | 0 refills | Status: DC

## 2022-05-23 ENCOUNTER — Other Ambulatory Visit (INDEPENDENT_AMBULATORY_CARE_PROVIDER_SITE_OTHER): Payer: Managed Care, Other (non HMO)

## 2022-05-23 DIAGNOSIS — E782 Mixed hyperlipidemia: Secondary | ICD-10-CM | POA: Diagnosis not present

## 2022-05-23 DIAGNOSIS — R7303 Prediabetes: Secondary | ICD-10-CM

## 2022-05-23 DIAGNOSIS — I10 Essential (primary) hypertension: Secondary | ICD-10-CM

## 2022-05-24 LAB — LIPID PANEL
Chol/HDL Ratio: 7 ratio — ABNORMAL HIGH (ref 0.0–5.0)
Cholesterol, Total: 267 mg/dL — ABNORMAL HIGH (ref 100–199)
HDL: 38 mg/dL — ABNORMAL LOW (ref >39)
LDL Chol Calc (NIH): 185 mg/dL — ABNORMAL HIGH (ref 0–99)
Triglycerides: 229 mg/dL — ABNORMAL HIGH (ref 0–149)
VLDL Cholesterol Cal: 44 mg/dL — ABNORMAL HIGH (ref 5–40)

## 2022-05-24 LAB — HEPATIC FUNCTION PANEL
ALT: 150 IU/L — ABNORMAL HIGH (ref 0–44)
AST: 146 IU/L — ABNORMAL HIGH (ref 0–40)
Albumin: 4.1 g/dL (ref 4.1–5.1)
Alkaline Phosphatase: 200 IU/L — ABNORMAL HIGH (ref 44–121)
Bilirubin Total: 1.1 mg/dL (ref 0.0–1.2)
Bilirubin, Direct: 0.49 mg/dL — ABNORMAL HIGH (ref 0.00–0.40)
Total Protein: 6.5 g/dL (ref 6.0–8.5)

## 2022-05-24 LAB — CBC
Hematocrit: 36.2 % — ABNORMAL LOW (ref 37.5–51.0)
Hemoglobin: 12.4 g/dL — ABNORMAL LOW (ref 13.0–17.7)
MCH: 31.8 pg (ref 26.6–33.0)
MCHC: 34.3 g/dL (ref 31.5–35.7)
MCV: 93 fL (ref 79–97)
Platelets: 150 10*3/uL (ref 150–450)
RBC: 3.9 x10E6/uL — ABNORMAL LOW (ref 4.14–5.80)
RDW: 13.8 % (ref 11.6–15.4)
WBC: 8.1 10*3/uL (ref 3.4–10.8)

## 2022-05-24 LAB — BASIC METABOLIC PANEL
BUN/Creatinine Ratio: 9 (ref 9–20)
BUN: 9 mg/dL (ref 6–20)
CO2: 23 mmol/L (ref 20–29)
Calcium: 9.6 mg/dL (ref 8.7–10.2)
Chloride: 91 mmol/L — ABNORMAL LOW (ref 96–106)
Creatinine, Ser: 1 mg/dL (ref 0.76–1.27)
Glucose: 117 mg/dL — ABNORMAL HIGH (ref 70–99)
Potassium: 4.4 mmol/L (ref 3.5–5.2)
Sodium: 141 mmol/L (ref 134–144)
eGFR: 99 mL/min/{1.73_m2} (ref >59)

## 2022-05-24 LAB — HEMOGLOBIN A1C
Est. average glucose Bld gHb Est-mCnc: 134 mg/dL
Hgb A1c MFr Bld: 6.3 % — ABNORMAL HIGH (ref 4.8–5.6)

## 2022-05-25 DIAGNOSIS — R790 Abnormal level of blood mineral: Secondary | ICD-10-CM

## 2022-05-25 DIAGNOSIS — M1A9XX0 Chronic gout, unspecified, without tophus (tophi): Secondary | ICD-10-CM

## 2022-05-25 DIAGNOSIS — R1011 Right upper quadrant pain: Secondary | ICD-10-CM

## 2022-05-25 DIAGNOSIS — R1110 Vomiting, unspecified: Secondary | ICD-10-CM

## 2022-05-27 ENCOUNTER — Encounter: Payer: Self-pay | Admitting: Primary Care

## 2022-05-27 ENCOUNTER — Ambulatory Visit: Payer: Managed Care, Other (non HMO) | Admitting: Primary Care

## 2022-05-27 VITALS — BP 150/100 | HR 91 | Temp 97.8°F | Resp 16 | Ht 71.0 in | Wt 208.1 lb

## 2022-05-27 DIAGNOSIS — R197 Diarrhea, unspecified: Secondary | ICD-10-CM

## 2022-05-27 DIAGNOSIS — R112 Nausea with vomiting, unspecified: Secondary | ICD-10-CM

## 2022-05-27 DIAGNOSIS — R748 Abnormal levels of other serum enzymes: Secondary | ICD-10-CM

## 2022-05-27 DIAGNOSIS — R053 Chronic cough: Secondary | ICD-10-CM

## 2022-05-27 DIAGNOSIS — I10 Essential (primary) hypertension: Secondary | ICD-10-CM | POA: Diagnosis not present

## 2022-05-27 DIAGNOSIS — R1011 Right upper quadrant pain: Secondary | ICD-10-CM

## 2022-05-27 NOTE — Assessment & Plan Note (Signed)
Insurance will not cover Pulmicort. He has yet to reach out regarding other options for coverage.  Will treat acute issues for now, consider Qvar or other inhaler for persistent cough. Also recommend omeprazole resumption if warranted.  Await other test results.

## 2022-05-27 NOTE — Assessment & Plan Note (Signed)
CMP, lipase, mononucleosis, CMV, EBV, CBC with differential, acute hepatitis panel pending. Strict ED precautions provided.  Remain off of statin therapy.

## 2022-05-27 NOTE — Progress Notes (Signed)
Subjective:    Patient ID: Nathaniel Frank, male    DOB: 1985-06-17, 37 y.o.   MRN: [id number]  Abnormal Lab Associated symptoms include chills, fatigue, nausea and vomiting. Pertinent negatives include no fever.    Nathaniel Frank is a very pleasant 38 y.o. male with a history of hypertension, GAD, chronic gout, prediabetes, hyperlipidemia who presents today for follow up of chronic cough, and to discuss abdominal pain.   History of chronic cough was last evaluated for this in January 2024.  Cough began in February 2023 after an acute illness and has been intermittent since.  During his last visit he was instructed to resume omeprazole 20 mg daily and a prescription for Pulmicort inhaler was sent to his pharmacy.  Since his last visit he has not resumed omeprazole and his insurance would not cover Pulmicort.  Symptom onset 1 week ago with cramping to feet, worse at night when laying in bed, also noticeable when driving.  Currently managed on valsartan-hydrochlorothiazide 160-12.5 mg daily which was initiated mid January 2024.  He checks his blood pressure at home which runs 140-150/100.   About 3-4 days ago he began to notice constant RUQ abdominal pain which feels sharp/pressure. Also with symptoms of fatigue, dizziness, chills without fevers, intermittent sweats, reduction of appetite.  His pain is no worse with eating or drinking.  He did notice an episode of radiation of pain through his right back today. Around 2 pm daily he feels better, increased energy, denies vomiting.  He continues to vomit daily which occurs around 10 am, about 1 hour after he starts working. His vomiting began about one year ago. He will begin by coughing followed by vomiting.   One month ago he developed recurrent diarrhea, watery with mucous, occurring five times daily on average, with or without food.  He continues to experience his diarrhea presently.  His coworker recently mentioned that he looks pale.  His last  dose of pravastatin was one week ago as he ran out of medication. He is compliant to valsartan-HCTZ 160-12.5 mg daily.  BP Readings from Last 3 Encounters:  05/27/22 (!) 150/100  04/10/22 (!) 152/102  03/10/22 (!) 152/102   Wt Readings from Last 3 Encounters:  05/27/22 208 lb 2 oz (94.4 kg)  04/10/22 220 lb (99.8 kg)  03/10/22 217 lb (98.4 kg)       Review of Systems  Constitutional:  Positive for chills and fatigue. Negative for fever.  Gastrointestinal:  Positive for diarrhea, nausea and vomiting. Negative for blood in stool.         Past Medical History:  Diagnosis Date   Acute non-recurrent maxillary sinusitis 06/22/2019   Elevated blood pressure reading    GAD (generalized anxiety disorder)    PVC (premature ventricular contraction)     Social History   Socioeconomic History   Marital status: Married    Spouse name: Not on file   Number of children: Not on file   Years of education: Not on file   Highest education level: Not on file  Occupational History   Not on file  Tobacco Use   Smoking status: Never   Smokeless tobacco: Never  Vaping Use   Vaping Use: Every day   Substances: Flavoring  Substance and Sexual Activity   Alcohol use: Yes    Comment: soical   Drug use: Yes    Types: Marijuana   Sexual activity: Yes    Birth control/protection: None  Other Topics Concern   Not  on file  Social History Narrative   Engaged.   3 children.   Works for [employer].   Enjoys golfing.    Social Determinants of Health   Financial Resource Strain: Not on file  Food Insecurity: Not on file  Transportation Needs: Not on file  Physical Activity: Not on file  Stress: Not on file  Social Connections: Not on file  Intimate Partner Violence: Not on file    Past Surgical History:  Procedure Laterality Date   TONSILLECTOMY AND [place] Right 11/19/2020    Family History  Problem Relation Age of Onset   Renal cancer  Father     No Known Allergies  Current Outpatient Medications on File Prior to Visit  Medication Sig Dispense Refill   Colchicine 0.6 MG CAPS TAKE 1 CAPSULE BY MOUTH TWICE DAILY FOR 3 DAYS THEN TAKE 1 CAPSULE BY MOUTH DAILY FOR 3 DAYS     omeprazole (PRILOSEC) 20 MG capsule Take 1 capsule (20 mg total) by mouth daily. For heartburn 90 capsule 0   valsartan-hydrochlorothiazide (DIOVAN-HCT) 160-12.5 MG tablet Take 1 tablet by mouth daily. for blood pressure. 90 tablet 0   venlafaxine XR (EFFEXOR-XR) 37.5 MG 24 hr capsule TAKE 1 CAPSULE BY MOUTH DAILY  WITH BREAKFAST FOR ANXIETY. TAKE WITH THE 75MG  CAPSULE. 90 capsule 2   venlafaxine XR (EFFEXOR-XR) 75 MG 24 hr capsule TAKE 1 CAPSULE BY MOUTH DAILY  WITH BREAKFAST FOR ANXIETY 90 capsule 2   pravastatin (PRAVACHOL) 40 MG tablet TAKE 1 TABLET BY MOUTH DAILY FOR CHOLESTEROL (Patient not taking: Reported on 05/27/2022) 90 tablet 3   No current facility-administered medications on file prior to visit.    BP (!) 150/100   Pulse 91   Temp 97.8 F (36.6 C)   Resp 16   Ht 5\' 11"  (1.803 m)   Wt 208 lb 2 oz (94.4 kg)   SpO2 98%   BMI 29.03 kg/m  Objective:   Physical Exam Constitutional:      General: He is not in acute distress.    Appearance: He is not ill-appearing.  Eyes:     Conjunctiva/sclera: Conjunctivae normal.  Cardiovascular:     Rate and Rhythm: Normal rate and regular rhythm.  Pulmonary:     Effort: Pulmonary effort is normal.     Breath sounds: Normal breath sounds.  Abdominal:     General: Bowel sounds are normal.     Palpations: Abdomen is soft.     Tenderness: There is abdominal tenderness in the right upper quadrant.  Skin:    Coloration: Skin is pale.  Neurological:     Mental Status: He is oriented to person, place, and time.  Psychiatric:        Mood and Affect: Mood normal.           Assessment & Plan:  Right upper quadrant abdominal pain Assessment & Plan: Abnormal exam today.  Stat ultrasound for  right upper quadrant ordered and pending for evaluation of gallbladder.  CMP, lipase, mononucleosis, CMV, EBV, CBC with differential pending. Strict ED precautions provided.  Orders: -     US ABDOMEN LIMITED RUQ (LIVER/GB); Future -     Comprehensive metabolic panel -     Mononucleosis screen -     CBC with Differential/Platelet -     Epstein-Barr virus VCA, IgM -     CMV abs, IgG+IgM (cytomegalovirus) -     Iron, TIBC and Ferritin Panel -  Acute Viral Hepatitis (HAV, HBV, HCV) -     Lipase -     TSH  Elevated liver enzymes Assessment & Plan: CMP, lipase, mononucleosis, CMV, EBV, CBC with differential, acute hepatitis panel pending. Strict ED precautions provided.  Remain off of statin therapy.  Orders: -     Comprehensive metabolic panel -     Mononucleosis screen -     CBC with Differential/Platelet -     Epstein-Barr virus VCA, IgM -     CMV abs, IgG+IgM (cytomegalovirus) -     Iron, TIBC and Ferritin Panel -     Acute Viral Hepatitis (HAV, HBV, HCV) -     Lipase -     TSH  Nausea vomiting and diarrhea -     Comprehensive metabolic panel -     Mononucleosis screen -     CBC with Differential/Platelet -     Epstein-Barr virus VCA, IgM -     CMV abs, IgG+IgM (cytomegalovirus) -     Iron, TIBC and Ferritin Panel -     Acute Viral Hepatitis (HAV, HBV, HCV) -     Lipase -     TSH  Essential hypertension Assessment & Plan: Uncontrolled.  Unclear if his blood pressure is uncontrolled given his acute illness versus resistant hypertension.  Continue valsartan-hydrochlorothiazide 160-12.5 milligrams daily for now. Await lab results prior to adjustment.   Persistent cough for 3 weeks or longer Assessment & Plan: Insurance will not cover Pulmicort. He has yet to reach out regarding other options for coverage.  Will treat acute issues for now, consider Qvar or other inhaler for persistent cough. Also recommend omeprazole resumption if warranted.  Await other  test results.         Pleas Koch, NP

## 2022-05-27 NOTE — Telephone Encounter (Signed)
Added patient on for 3:20pm.

## 2022-05-27 NOTE — Assessment & Plan Note (Addendum)
Abnormal exam today.  Stat ultrasound for right upper quadrant ordered and pending for evaluation of gallbladder.  CMP, lipase, mononucleosis, CMV, EBV, CBC with differential pending. Strict ED precautions provided.

## 2022-05-27 NOTE — Assessment & Plan Note (Signed)
Uncontrolled.  Unclear if his blood pressure is uncontrolled given his acute illness versus resistant hypertension.  Continue valsartan-hydrochlorothiazide 160-12.5 milligrams daily for now. Await lab results prior to adjustment.

## 2022-05-27 NOTE — Telephone Encounter (Signed)
Please add patient to 3:20 pm slot for today.

## 2022-05-28 ENCOUNTER — Ambulatory Visit: Payer: Managed Care, Other (non HMO) | Admitting: Primary Care

## 2022-05-28 ENCOUNTER — Other Ambulatory Visit: Payer: Managed Care, Other (non HMO)

## 2022-05-28 ENCOUNTER — Ambulatory Visit (HOSPITAL_COMMUNITY)
Admission: RE | Admit: 2022-05-28 | Discharge: 2022-05-28 | Disposition: A | Discharge status: Home / Self Care | Payer: Managed Care, Other (non HMO) | Source: Ambulatory Visit | Attending: Primary Care | Admitting: Primary Care

## 2022-05-28 DIAGNOSIS — R1011 Right upper quadrant pain: Secondary | ICD-10-CM | POA: Diagnosis present

## 2022-05-28 LAB — ACUTE VIRAL HEPATITIS (HAV, HBV, HCV)
HCV Ab: NONREACTIVE
Hep A IgM: NEGATIVE
Hep B C IgM: NEGATIVE
Hepatitis B Surface Ag: NEGATIVE

## 2022-05-28 LAB — HCV INTERPRETATION

## 2022-05-29 LAB — CMV ABS, IGG+IGM (CYTOMEGALOVIRUS)
CMV Ab - IgG: 1.7 U/mL — ABNORMAL HIGH (ref 0.00–0.59)
CMV IgM Ser EIA-aCnc: <30 AU/mL (ref 0.0–29.9)

## 2022-05-29 LAB — COMPREHENSIVE METABOLIC PANEL
ALT: 72 IU/L — ABNORMAL HIGH (ref 0–44)
AST: 53 IU/L — ABNORMAL HIGH (ref 0–40)
Albumin/Globulin Ratio: 1.7 (ref 1.2–2.2)
Albumin: 4.5 g/dL (ref 4.1–5.1)
Alkaline Phosphatase: 172 IU/L — ABNORMAL HIGH (ref 44–121)
BUN/Creatinine Ratio: 11 (ref 9–20)
BUN: 9 mg/dL (ref 6–20)
Bilirubin Total: 0.7 mg/dL (ref 0.0–1.2)
CO2: 27 mmol/L (ref 20–29)
Calcium: 9.8 mg/dL (ref 8.7–10.2)
Chloride: 99 mmol/L (ref 96–106)
Creatinine, Ser: 0.79 mg/dL (ref 0.76–1.27)
Globulin, Total: 2.6 g/dL (ref 1.5–4.5)
Glucose: 98 mg/dL (ref 70–99)
Potassium: 4 mmol/L (ref 3.5–5.2)
Sodium: 140 mmol/L (ref 134–144)
Total Protein: 7.1 g/dL (ref 6.0–8.5)
eGFR: 117 mL/min/{1.73_m2} (ref >59)

## 2022-05-29 LAB — IRON,TIBC AND FERRITIN PANEL
Ferritin: 1965 ng/mL — ABNORMAL HIGH (ref 30–400)
Iron Saturation: 32 % (ref 15–55)
Iron: 102 ug/dL (ref 38–169)
Total Iron Binding Capacity: 323 ug/dL (ref 250–450)
UIBC: 221 ug/dL (ref 111–343)

## 2022-05-29 LAB — CBC WITH DIFFERENTIAL/PLATELET
Basophils Absolute: 0.1 10*3/uL (ref 0.0–0.2)
Basos: 1 %
EOS (ABSOLUTE): 0.1 10*3/uL (ref 0.0–0.4)
Eos: 1 %
Hematocrit: 40.1 % (ref 37.5–51.0)
Hemoglobin: 13.4 g/dL (ref 13.0–17.7)
Immature Grans (Abs): 0 10*3/uL (ref 0.0–0.1)
Immature Granulocytes: 0 %
Lymphocytes Absolute: 2.7 10*3/uL (ref 0.7–3.1)
Lymphs: 33 %
MCH: 31.6 pg (ref 26.6–33.0)
MCHC: 33.4 g/dL (ref 31.5–35.7)
MCV: 95 fL (ref 79–97)
Monocytes Absolute: 1 10*3/uL — ABNORMAL HIGH (ref 0.1–0.9)
Monocytes: 12 %
Neutrophils Absolute: 4.2 10*3/uL (ref 1.4–7.0)
Neutrophils: 53 %
Platelets: 241 10*3/uL (ref 150–450)
RBC: 4.24 x10E6/uL (ref 4.14–5.80)
RDW: 13.7 % (ref 11.6–15.4)
WBC: 8 10*3/uL (ref 3.4–10.8)

## 2022-05-29 LAB — TSH: TSH: 3.92 u[IU]/mL (ref 0.450–4.500)

## 2022-05-29 LAB — EPSTEIN-BARR VIRUS VCA, IGM: EBV VCA IgM: <36 U/mL (ref 0.0–35.9)

## 2022-05-29 LAB — MONONUCLEOSIS SCREEN: Mono Screen: NEGATIVE

## 2022-05-29 LAB — LIPASE: Lipase: 36 U/L (ref 13–78)

## 2022-05-30 LAB — IRON,TIBC AND FERRITIN PANEL
Ferritin: 1555 ng/mL — ABNORMAL HIGH (ref 30–400)
Iron Saturation: 59 % — ABNORMAL HIGH (ref 15–55)
Iron: 168 ug/dL (ref 38–169)
Total Iron Binding Capacity: 283 ug/dL (ref 250–450)
UIBC: 115 ug/dL (ref 111–343)

## 2022-06-01 ENCOUNTER — Other Ambulatory Visit: Payer: Self-pay | Admitting: Primary Care

## 2022-06-01 DIAGNOSIS — R112 Nausea with vomiting, unspecified: Secondary | ICD-10-CM

## 2022-06-01 DIAGNOSIS — R1011 Right upper quadrant pain: Secondary | ICD-10-CM

## 2022-06-01 DIAGNOSIS — R748 Abnormal levels of other serum enzymes: Secondary | ICD-10-CM

## 2022-06-01 DIAGNOSIS — R790 Abnormal level of blood mineral: Secondary | ICD-10-CM

## 2022-06-02 ENCOUNTER — Encounter: Payer: Self-pay | Admitting: Primary Care

## 2022-06-02 ENCOUNTER — Encounter (INDEPENDENT_AMBULATORY_CARE_PROVIDER_SITE_OTHER): Payer: Self-pay | Admitting: *Deleted

## 2022-06-04 ENCOUNTER — Other Ambulatory Visit: Payer: Self-pay | Admitting: Primary Care

## 2022-06-04 DIAGNOSIS — R790 Abnormal level of blood mineral: Secondary | ICD-10-CM

## 2022-06-04 DIAGNOSIS — R1011 Right upper quadrant pain: Secondary | ICD-10-CM

## 2022-06-04 DIAGNOSIS — R748 Abnormal levels of other serum enzymes: Secondary | ICD-10-CM

## 2022-06-05 LAB — COMPREHENSIVE METABOLIC PANEL
ALT: 58 IU/L — ABNORMAL HIGH (ref 0–44)
AST: 61 IU/L — ABNORMAL HIGH (ref 0–40)
Albumin/Globulin Ratio: 1.8 (ref 1.2–2.2)
Albumin: 4.2 g/dL (ref 4.1–5.1)
Alkaline Phosphatase: 107 IU/L (ref 44–121)
BUN/Creatinine Ratio: 7 — ABNORMAL LOW (ref 9–20)
BUN: 6 mg/dL (ref 6–20)
Bilirubin Total: 1.1 mg/dL (ref 0.0–1.2)
CO2: 20 mmol/L (ref 20–29)
Calcium: 9 mg/dL (ref 8.7–10.2)
Chloride: 99 mmol/L (ref 96–106)
Creatinine, Ser: 0.85 mg/dL (ref 0.76–1.27)
Globulin, Total: 2.4 g/dL (ref 1.5–4.5)
Glucose: 210 mg/dL — ABNORMAL HIGH (ref 70–99)
Potassium: 3.6 mmol/L (ref 3.5–5.2)
Sodium: 137 mmol/L (ref 134–144)
Total Protein: 6.6 g/dL (ref 6.0–8.5)
eGFR: 114 mL/min/{1.73_m2} (ref >59)

## 2022-06-05 LAB — IRON,TIBC AND FERRITIN PANEL
Ferritin: 1290 ng/mL — ABNORMAL HIGH (ref 30–400)
Iron Saturation: 56 % — ABNORMAL HIGH (ref 15–55)
Iron: 161 ug/dL (ref 38–169)
Total Iron Binding Capacity: 286 ug/dL (ref 250–450)
UIBC: 125 ug/dL (ref 111–343)

## 2022-06-05 LAB — CBC WITH DIFFERENTIAL/PLATELET
Basophils Absolute: 0.1 10*3/uL (ref 0.0–0.2)
Basos: 1 %
EOS (ABSOLUTE): 0 10*3/uL (ref 0.0–0.4)
Eos: 1 %
Hematocrit: 37.2 % — ABNORMAL LOW (ref 37.5–51.0)
Hemoglobin: 12.7 g/dL — ABNORMAL LOW (ref 13.0–17.7)
Immature Grans (Abs): 0 10*3/uL (ref 0.0–0.1)
Immature Granulocytes: 0 %
Lymphocytes Absolute: 1.8 10*3/uL (ref 0.7–3.1)
Lymphs: 28 %
MCH: 32.4 pg (ref 26.6–33.0)
MCHC: 34.1 g/dL (ref 31.5–35.7)
MCV: 95 fL (ref 79–97)
Monocytes Absolute: 0.6 10*3/uL (ref 0.1–0.9)
Monocytes: 9 %
Neutrophils Absolute: 4 10*3/uL (ref 1.4–7.0)
Neutrophils: 61 %
Platelets: 226 10*3/uL (ref 150–450)
RBC: 3.92 x10E6/uL — ABNORMAL LOW (ref 4.14–5.80)
RDW: 13.6 % (ref 11.6–15.4)
WBC: 6.6 10*3/uL (ref 3.4–10.8)

## 2022-06-05 LAB — ANA: Anti Nuclear Antibody (ANA): NEGATIVE

## 2022-06-06 LAB — HGB A1C W/O EAG: Hgb A1c MFr Bld: 5.9 % — ABNORMAL HIGH (ref 4.8–5.6)

## 2022-06-06 LAB — ALPHA-1-ANTITRYPSIN: A-1 Antitrypsin: 135 mg/dL (ref 95–164)

## 2022-06-06 LAB — CERULOPLASMIN: Ceruloplasmin: 16.2 mg/dL (ref 16.0–31.0)

## 2022-06-06 LAB — ANTI-SMOOTH MUSCLE ANTIBODY, IGG: Smooth Muscle Ab: 6 Units (ref 0–19)

## 2022-06-06 LAB — SPECIMEN STATUS REPORT

## 2022-06-08 NOTE — Progress Notes (Unsigned)
GI Office Note    Referring Provider: Pleas Koch, NP Primary Care Physician:  Pleas Koch, NP  Primary Gastroenterologist:  Chief Complaint   No chief complaint on file.   History of Present Illness   Nathaniel Frank is a 37 y.o. male presenting today at the request of Alma Friendly, NP for one year history of vomiting, RUQ pain, diarrhea, elevated iron/ferritin. Hematology consulted and believes this is "reactionary". ***other labs  RUQ U/S 05/2022: -normal     Medications   Current Outpatient Medications  Medication Sig Dispense Refill   Colchicine 0.6 MG CAPS TAKE 1 CAPSULE BY MOUTH TWICE DAILY FOR 3 DAYS THEN TAKE 1 CAPSULE BY MOUTH DAILY FOR 3 DAYS     omeprazole (PRILOSEC) 20 MG capsule Take 1 capsule (20 mg total) by mouth daily. For heartburn 90 capsule 0   pravastatin (PRAVACHOL) 40 MG tablet TAKE 1 TABLET BY MOUTH DAILY FOR CHOLESTEROL (Patient not taking: Reported on 05/27/2022) 90 tablet 3   valsartan-hydrochlorothiazide (DIOVAN-HCT) 160-12.5 MG tablet Take 1 tablet by mouth daily. for blood pressure. 90 tablet 0   venlafaxine XR (EFFEXOR-XR) 37.5 MG 24 hr capsule TAKE 1 CAPSULE BY MOUTH DAILY  WITH BREAKFAST FOR ANXIETY. TAKE WITH THE 75MG CAPSULE. 90 capsule 2   venlafaxine XR (EFFEXOR-XR) 75 MG 24 hr capsule TAKE 1 CAPSULE BY MOUTH DAILY  WITH BREAKFAST FOR ANXIETY 90 capsule 2   No current facility-administered medications for this visit.    Allergies   Allergies as of 06/09/2022   (No Known Allergies)     Past Medical History   Past Medical History:  Diagnosis Date   Acute non-recurrent maxillary sinusitis 06/22/2019   Elevated blood pressure reading    GAD (generalized anxiety disorder)    PVC (premature ventricular contraction)     Past Surgical History   Past Surgical History:  Procedure Laterality Date   TONSILLECTOMY AND ADENOIDECTOMY  1999   TOTAL HIP ARTHROPLASTY Right 11/19/2020    Past Family History   Family  History  Problem Relation Age of Onset   Renal cancer Father     Past Social History   Social History   Socioeconomic History   Marital status: Married    Spouse name: Not on file   Number of children: Not on file   Years of education: Not on file   Highest education level: Not on file  Occupational History   Not on file  Tobacco Use   Smoking status: Never   Smokeless tobacco: Never  Vaping Use   Vaping Use: Every day   Substances: Flavoring  Substance and Sexual Activity   Alcohol use: Yes    Comment: soical   Drug use: Yes    Types: Marijuana   Sexual activity: Yes    Birth control/protection: None  Other Topics Concern   Not on file  Social History Narrative   Engaged.   3 children.   Works for [employer].   Enjoys golfing.    Social Determinants of Health   Financial Resource Strain: Not on file  Food Insecurity: Not on file  Transportation Needs: Not on file  Physical Activity: Not on file  Stress: Not on file  Social Connections: Not on file  Intimate Partner Violence: Not on file    Review of Systems   General: Negative for anorexia, weight loss, fever, chills, fatigue, weakness. ENT: Negative for hoarseness, difficulty swallowing , nasal congestion. CV: Negative for chest pain, angina, palpitations,  dyspnea on exertion, peripheral edema.  Respiratory: Negative for dyspnea at rest, dyspnea on exertion, cough, sputum, wheezing.  GI: See history of present illness. GU:  Negative for dysuria, hematuria, urinary incontinence, urinary frequency, nocturnal urination.  Endo: Negative for unusual weight change.     Physical Exam   There were no vitals taken for this visit.   General: Well-nourished, well-developed in no acute distress.  Eyes: No icterus. Mouth: Oropharyngeal mucosa moist and pink , no lesions erythema or exudate. Lungs: Clear to auscultation bilaterally.  Heart: Regular rate and rhythm, no murmurs rubs or gallops.  Abdomen: Bowel  sounds are normal, nontender, nondistended, no hepatosplenomegaly or masses,  no abdominal bruits or hernia , no rebound or guarding.  Rectal: ***  Extremities: No lower extremity edema. No clubbing or deformities. Neuro: Alert and oriented x 4   Skin: Warm and dry, no jaundice.   Psych: Alert and cooperative, normal mood and affect.  Labs   Lab Results  Component Value Date   CREATININE 0.85 06/04/2022   BUN 6 06/04/2022   NA 137 06/04/2022   K 3.6 06/04/2022   CL 99 06/04/2022   CO2 20 06/04/2022   Lab Results  Component Value Date   ALT 58 (H) 06/04/2022   AST 61 (H) 06/04/2022   ALKPHOS 107 06/04/2022   BILITOT 1.1 06/04/2022   Lab Results  Component Value Date   WBC 6.6 06/04/2022   HGB 12.7 (L) 06/04/2022   HCT 37.2 (L) 06/04/2022   MCV 95 06/04/2022   PLT 226 06/04/2022   Lab Results  Component Value Date   IRON 161 06/04/2022   TIBC 286 06/04/2022   FERRITIN 1,290 (H) 06/04/2022   Lab Results  Component Value Date   HGBA1C 5.9 (H) 06/04/2022    Imaging Studies   US ABDOMEN LIMITED RUQ (LIVER/GB)  Result Date: 05/28/2022 CLINICAL DATA:  Right upper quadrant abdominal pain with nausea, vomiting and diarrhea x1 day. EXAM: ULTRASOUND ABDOMEN LIMITED RIGHT UPPER QUADRANT COMPARISON:  None Available. FINDINGS: Gallbladder: No gallstones or wall thickening visualized. No sonographic Murphy sign noted by sonographer. Common bile duct: Diameter: 5.9 mm Liver: No focal lesion identified. Within normal limits in parenchymal echogenicity. Portal vein is patent on color Doppler imaging with normal direction of blood flow towards the liver. Other: None. IMPRESSION: Normal right upper quadrant ultrasound. Electronically Signed   By: Emmit Alexanders M.D.   On: 05/28/2022 09:01    Assessment       PLAN   ***   Laureen Ochs. Bobby Rumpf, [place], [facility]

## 2022-06-09 ENCOUNTER — Ambulatory Visit: Payer: Managed Care, Other (non HMO) | Admitting: Gastroenterology

## 2022-06-09 ENCOUNTER — Encounter: Payer: Self-pay | Admitting: Gastroenterology

## 2022-06-09 VITALS — BP 126/78 | HR 80 | Temp 99.1°F | Ht 71.0 in | Wt 211.0 lb

## 2022-06-09 DIAGNOSIS — R1011 Right upper quadrant pain: Secondary | ICD-10-CM | POA: Diagnosis not present

## 2022-06-09 DIAGNOSIS — R112 Nausea with vomiting, unspecified: Secondary | ICD-10-CM | POA: Diagnosis not present

## 2022-06-09 DIAGNOSIS — R748 Abnormal levels of other serum enzymes: Secondary | ICD-10-CM

## 2022-06-09 NOTE — Patient Instructions (Addendum)
Please complete labs and urine test. We will be in touch with results as available. Continue to eat healthy. Avoid herbal supplements, tap water.  Call with recurrent abdominal pain or vomiting.   It was a pleasure to see you today. I want to create trusting relationships with patients and provide genuine, compassionate, and quality care. I truly value your feedback, so please be on the lookout for a survey regarding your visit with me today. I appreciate your time in completing this!

## 2022-06-13 LAB — COPPER, URINE - RANDOM OR 24 HOUR
Copper / Creatinine Ratio: 7 ug/g{creat} (ref 0–49)
Copper, 24H Ur: 12 ug/(24.h) (ref 3–35)
Copper, Ur: 4 ug/L
Creatinine(Crt),U: 0.54 g/L (ref 0.30–3.00)

## 2022-06-17 MED ORDER — COLCHICINE 0.6 MG PO CAPS: ORAL_CAPSULE | ORAL | 0 refills | Status: DC

## 2022-06-19 ENCOUNTER — Encounter: Payer: Self-pay | Admitting: Radiology

## 2022-06-24 LAB — CBC WITH DIFFERENTIAL/PLATELET
Basophils Absolute: 0.1 10*3/uL (ref 0.0–0.2)
Basos: 1 %
EOS (ABSOLUTE): 0.1 10*3/uL (ref 0.0–0.4)
Eos: 2 %
Hematocrit: 32.9 % — ABNORMAL LOW (ref 37.5–51.0)
Hemoglobin: 11.4 g/dL — ABNORMAL LOW (ref 13.0–17.7)
Immature Grans (Abs): 0 10*3/uL (ref 0.0–0.1)
Immature Granulocytes: 0 %
Lymphocytes Absolute: 1.9 10*3/uL (ref 0.7–3.1)
Lymphs: 25 %
MCH: 32.7 pg (ref 26.6–33.0)
MCHC: 34.7 g/dL (ref 31.5–35.7)
MCV: 94 fL (ref 79–97)
Monocytes Absolute: 0.8 10*3/uL (ref 0.1–0.9)
Monocytes: 11 %
Neutrophils Absolute: 4.7 10*3/uL (ref 1.4–7.0)
Neutrophils: 61 %
Platelets: 193 10*3/uL (ref 150–450)
RBC: 3.49 x10E6/uL — ABNORMAL LOW (ref 4.14–5.80)
RDW: 12.6 % (ref 11.6–15.4)
WBC: 7.5 10*3/uL (ref 3.4–10.8)

## 2022-06-24 LAB — IGG, IGA, IGM
IgA/Immunoglobulin A, Serum: 153 mg/dL (ref 90–386)
IgG (Immunoglobin G), Serum: 700 mg/dL (ref 603–1613)
IgM (Immunoglobulin M), Srm: 55 mg/dL (ref 20–172)

## 2022-06-24 LAB — IRON,TIBC AND FERRITIN PANEL
Ferritin: 971 ng/mL — ABNORMAL HIGH (ref 30–400)
Iron Saturation: 18 % (ref 15–55)
Iron: 55 ug/dL (ref 38–169)
Total Iron Binding Capacity: 298 ug/dL (ref 250–450)
UIBC: 243 ug/dL (ref 111–343)

## 2022-06-24 LAB — HEPATIC FUNCTION PANEL
ALT: 36 IU/L (ref 0–44)
AST: 25 IU/L (ref 0–40)
Albumin: 3.9 g/dL — ABNORMAL LOW (ref 4.1–5.1)
Alkaline Phosphatase: 66 IU/L (ref 44–121)
Bilirubin Total: 0.5 mg/dL (ref 0.0–1.2)
Bilirubin, Direct: 0.22 mg/dL (ref 0.00–0.40)
Total Protein: 6 g/dL (ref 6.0–8.5)

## 2022-06-24 LAB — HEMOCHROMATOSIS DNA-PCR(C282Y,H63D)

## 2022-06-24 LAB — MITOCHONDRIAL ANTIBODIES: Mitochondrial Ab: <20 Units (ref 0.0–20.0)

## 2022-06-24 LAB — TISSUE TRANSGLUTAMINASE, IGA: Transglutaminase IgA: <2 U/mL (ref 0–3)

## 2022-06-26 ENCOUNTER — Telehealth: Payer: Self-pay

## 2022-06-26 DIAGNOSIS — R748 Abnormal levels of other serum enzymes: Secondary | ICD-10-CM

## 2022-06-26 DIAGNOSIS — D649 Anemia, unspecified: Secondary | ICD-10-CM

## 2022-06-26 NOTE — Telephone Encounter (Signed)
Noted, we will wait for next labs.

## 2022-06-26 NOTE — Telephone Encounter (Signed)
Pt called to let you know that he received the myChart message and states that he has not had any black or bloody stools or nosebleeds.

## 2022-07-03 ENCOUNTER — Ambulatory Visit (INDEPENDENT_AMBULATORY_CARE_PROVIDER_SITE_OTHER): Payer: Managed Care, Other (non HMO) | Admitting: Gastroenterology

## 2022-07-22 ENCOUNTER — Other Ambulatory Visit: Payer: Self-pay | Admitting: Primary Care

## 2022-07-22 DIAGNOSIS — K219 Gastro-esophageal reflux disease without esophagitis: Secondary | ICD-10-CM

## 2022-07-30 ENCOUNTER — Other Ambulatory Visit: Payer: Self-pay

## 2022-07-30 DIAGNOSIS — D649 Anemia, unspecified: Secondary | ICD-10-CM

## 2022-07-30 DIAGNOSIS — R748 Abnormal levels of other serum enzymes: Secondary | ICD-10-CM

## 2022-08-12 ENCOUNTER — Other Ambulatory Visit: Payer: Self-pay | Admitting: Primary Care

## 2022-08-12 DIAGNOSIS — I10 Essential (primary) hypertension: Secondary | ICD-10-CM

## 2022-08-12 MED ORDER — VALSARTAN-HYDROCHLOROTHIAZIDE 160-12.5 MG PO TABS: 1.0000 | ORAL_TABLET | Freq: Every day | ORAL | 1 refills | Status: DC

## 2022-08-12 NOTE — Telephone Encounter (Signed)
From: Alleen Borne To: Office of Doreene Nest, NP Sent: 08/12/2022 10:55 AM EDT Subject: Medication Renewal Request  Refills have been requested for the following medications:   valsartan-hydrochlorothiazide (DIOVAN-HCT) 160-12.5 MG tablet [Kaedyn Polivka K Lorimer Tiberio]  Preferred pharmacy: [facility] - [place], [place] - [address] Delivery method: [employer]

## 2022-08-15 DIAGNOSIS — Z3009 Encounter for other general counseling and advice on contraception: Secondary | ICD-10-CM

## 2022-09-07 DIAGNOSIS — F411 Generalized anxiety disorder: Secondary | ICD-10-CM

## 2022-09-08 MED ORDER — VENLAFAXINE HCL ER 75 MG PO CP24: ORAL_CAPSULE | ORAL | 0 refills | Status: AC

## 2022-09-08 MED ORDER — VENLAFAXINE HCL ER 37.5 MG PO CP24: ORAL_CAPSULE | ORAL | 0 refills | Status: AC

## 2022-10-10 ENCOUNTER — Ambulatory Visit: Payer: Managed Care, Other (non HMO) | Admitting: Urology

## 2023-03-25 ENCOUNTER — Telehealth: Payer: Medicaid Other | Admitting: Physician Assistant

## 2023-03-25 DIAGNOSIS — M109 Gout, unspecified: Secondary | ICD-10-CM

## 2023-03-25 MED ORDER — COLCHICINE 0.6 MG PO TABS: ORAL_TABLET | ORAL | 0 refills | Status: AC

## 2023-03-25 NOTE — Patient Instructions (Signed)
Alleen Borne, thank you for joining Piedad Climes, PA-C for today's virtual visit.  While this provider is not your primary care provider (PCP), if your PCP is located in our provider database this encounter information will be shared with them immediately following your visit.   A [facility] MyChart account gives you access to today's visit and all your visits, tests, and labs performed at [facility] " click here if you don't have a [facility] MyChart account or go to [email or web address]  Consent: (Patient) Nathaniel Frank provided verbal consent for this virtual visit at the beginning of the encounter.  Current Medications:  Current Outpatient Medications:    Colchicine 0.6 MG CAPS, Take 2 capsules by mouth at gout flare, followed by 1 capsule 2 hours later. Then take 1 capsule twice daily until flare resolves., Disp: 30 capsule, Rfl: 0   valsartan-hydrochlorothiazide (DIOVAN-HCT) 160-12.5 MG tablet, Take 1 tablet by mouth daily. for blood pressure., Disp: 90 tablet, Rfl: 1   venlafaxine XR (EFFEXOR-XR) 37.5 MG 24 hr capsule, TAKE 1 CAPSULE BY MOUTH DAILY  WITH BREAKFAST FOR ANXIETY. TAKE WITH THE 75MG  CAPSULE., Disp: 90 capsule, Rfl: 0   venlafaxine XR (EFFEXOR-XR) 75 MG 24 hr capsule, TAKE 1 CAPSULE BY MOUTH DAILY  WITH BREAKFAST FOR ANXIETY, Disp: 90 capsule, Rfl: 0   Medications ordered in this encounter:  No orders of the defined types were placed in this encounter.    *If you need refills on other medications prior to your next appointment, please contact your pharmacy*  Follow-Up: Call back or seek an in-person evaluation if the symptoms worsen or if the condition fails to improve as anticipated.  [facility] [phone]  Other Instructions Gout  Gout is painful swelling of your joints. Gout is a type of arthritis. It is caused by having too much uric acid in your body. Uric acid is a chemical that is made when your body breaks down  substances called purines. If your body has too much uric acid, sharp crystals can form and build up in your joints. This causes pain and swelling. Gout attacks can happen quickly and be very painful (acute gout). Over time, the attacks can affect more joints and happen more often (chronic gout). What are the causes? Gout is caused by too much uric acid in your blood. This can happen because: Your kidneys do not remove enough uric acid from your blood. Your body makes too much uric acid. You eat too many foods that are high in purines. These foods include organ meats, some seafood, and beer. Trauma or stress can bring on an attack. What increases the risk? Having a family history of gout. Being male and middle-aged. Being male and having gone through menopause. Having an organ transplant. Taking certain medicines. Having certain conditions, such as: Being very overweight (obese). Lead poisoning. Kidney disease. A skin condition called psoriasis. Other risks include: Losing weight too quickly. Not having enough water in the body (being dehydrated). Drinking alcohol, especially beer. Drinking beverages that are sweetened with a type of sugar called fructose. What are the signs or symptoms? An attack of acute gout often starts at night and usually happens in just one joint. The most common place is the big toe. Other joints that may be affected include joints of the feet, ankle, knee, fingers, wrist, or elbow. Symptoms may include: Very bad pain. Warmth. Swelling. Stiffness. Tenderness. The affected joint may be very painful to touch. Shiny, red, or purple skin.  Chills and fever. Chronic gout may cause symptoms more often. More joints may be involved. You may also have white or yellow lumps (tophi) on your hands or feet or in other areas near your joints. How is this treated? Treatment for an acute attack may include medicines for pain and swelling, such as: NSAIDs, such as  ibuprofen. Steroids taken by mouth or injected into a joint. Colchicine. This can be given by mouth or through an IV tube. Treatment to prevent future attacks may include: Taking small doses of NSAIDs or colchicine daily. Using a medicine that reduces uric acid levels in your blood, such as allopurinol. Making changes to your diet. You may need to see a food expert (dietitian) about what to eat and drink to prevent gout. Follow these instructions at home: During a gout attack  If told, put ice on the painful area. To do this: Put ice in a plastic bag. Place a towel between your skin and the bag. Leave the ice on for 20 minutes, 2-3 times a day. Take off the ice if your skin turns bright red. This is very important. If you cannot feel pain, heat, or cold, you have a greater risk of damage to the area. Raise the painful joint above the level of your heart as often as you can. Rest the joint as much as possible. If the joint is in your leg, you may be given crutches. Follow instructions from your doctor about what you cannot eat or drink. Avoiding future gout attacks Eat a low-purine diet. Avoid foods and drinks such as: Liver. Kidney. Anchovies. Asparagus. Herring. Mushrooms. Mussels. Beer. Stay at a healthy weight. If you want to lose weight, talk with your doctor. Do not lose weight too fast. Start or continue an exercise plan as told by your doctor. Eating and drinking Avoid drinks sweetened by fructose. Drink enough fluids to keep your pee (urine) pale yellow. If you drink alcohol: Limit how much you have to: 0-1 drink a day for women who are not pregnant. 0-2 drinks a day for men. Know how much alcohol is in a drink. In the [place] one drink equals one 12 oz bottle of beer (355 mL), one 5 oz glass of wine (148 mL), or one 1 oz glass of hard liquor (44 mL). General instructions Take over-the-counter and prescription medicines only as told by your doctor. Ask your doctor if  you should avoid driving or using machines while you are taking your medicine. Return to your normal activities when your doctor says that it is safe. Keep all follow-up visits. Where to find more information [employer]: [email or web address].[email or web address]/ Contact a doctor if: You have another gout attack. You still have symptoms of a gout attack after 10 days of treatment. You have problems (side effects) because of your medicines. You have chills or a fever. You have burning pain when you pee (urinate). You have pain in your lower back or belly. Get help right away if: You have very bad pain. Your pain cannot be controlled. You cannot pee. Summary Gout is painful swelling of the joints. The most common site of pain is the big toe, but it can affect other joints. Medicines and avoiding some foods can help to prevent and treat gout attacks. This information is not intended to replace advice given to you by your health care provider. Make sure you discuss any questions you have with your health care provider. Document Revised: 01/09/2021 Document Reviewed: 01/09/2021 Elsevier Patient Education  2024 Elsevier Inc.    If you have been instructed to have an in-person evaluation today at a local Urgent Care facility, please use the link below. It will take you to a list of all of our available [facility], including address, phone number and hours of operation. Please do not delay care.  [facility]  If you or a family member do not have a primary care provider, use the link below to schedule a visit and establish care. When you choose a [facility] primary care physician or advanced practice provider, you gain a long-term partner in health. Find a Primary Care Provider  Learn more about [facility]'s in-office and virtual care options:  - Get Care Now

## 2023-03-25 NOTE — Progress Notes (Signed)
Virtual Visit Consent   Nathaniel Frank, you are scheduled for a virtual visit with a [facility] provider today. Just as with appointments in the office, your consent must be obtained to participate. Your consent will be active for this visit and any virtual visit you may have with one of our providers in the next 365 days. If you have a MyChart account, a copy of this consent can be sent to you electronically.  As this is a virtual visit, video technology does not allow for your provider to perform a traditional examination. This may limit your provider's ability to fully assess your condition. If your provider identifies any concerns that need to be evaluated in person or the need to arrange testing (such as labs, EKG, etc.), we will make arrangements to do so. Although advances in technology are sophisticated, we cannot ensure that it will always work on either your end or our end. If the connection with a video visit is poor, the visit may have to be switched to a telephone visit. With either a video or telephone visit, we are not always able to ensure that we have a secure connection.  By engaging in this virtual visit, you consent to the provision of healthcare and authorize for your insurance to be billed (if applicable) for the services provided during this visit. Depending on your insurance coverage, you may receive a charge related to this service.  I need to obtain your verbal consent now. Are you willing to proceed with your visit today? Nathaniel Frank has provided verbal consent on 03/25/2023 for a virtual visit (video or telephone). Piedad Climes, [place]  Date: 03/25/2023 9:00 AM  Virtual Visit via Video Note   I, Piedad Climes, connected with  Nathaniel Frank  ([id number], 1985/08/27) on 03/25/23 at  8:45 AM EST by a video-enabled telemedicine application and verified that I am speaking with the correct person using two identifiers.  Location: Patient: Virtual Visit Location  Patient: Home Provider: Virtual Visit Location Provider: Home Office   I discussed the limitations of evaluation and management by telemedicine and the availability of in person appointments. The patient expressed understanding and agreed to proceed.    History of Present Illness: Nathaniel Frank is a 37 y.o. who identifies as a male who was assigned male at birth, and is being seen today for gout attack first noted this morning. Woke up noting R great toe pain and swelling with some warmth. Not severe but is present. Has a leftover colchicine that he took this morning but is out. Denies fever, chills. Denies change to diet. Is hydrating well. No increase in alcohol use. In terms of precipitating medications, he is no longer taking Diovan-HCT per his report.   HPI: HPI  Problems:  Patient Active Problem List   Diagnosis Date Noted   Nausea with vomiting 06/09/2022   Right upper quadrant abdominal pain 05/27/2022   Elevated liver enzymes 05/27/2022   Persistent cough for 3 weeks or longer 05/15/2022   Exposure to Mycobacterium tuberculosis 04/10/2022   Gastroesophageal reflux disease 03/10/2022   Hyperlipidemia 11/08/2021   Obstruction of left tear duct 03/07/2021   Skin lesion 03/07/2021   Preoperative clearance 10/16/2020   Prediabetes 02/10/2020   Chronic gout 01/09/2017   GAD (generalized anxiety disorder) 10/14/2016   Preventative health care 10/14/2016   Allergic rhinitis 01/04/2016   Essential hypertension 01/04/2016    Allergies: No Known Allergies Medications:  Current Outpatient Medications:    colchicine 0.6 MG tablet,  Take 2 capsules by mouth at gout flare, followed by 1 capsule 2 hours later. Then take 1 capsule twice daily until flare resolves., Disp: 30 tablet, Rfl: 0   venlafaxine XR (EFFEXOR-XR) 37.5 MG 24 hr capsule, TAKE 1 CAPSULE BY MOUTH DAILY  WITH BREAKFAST FOR ANXIETY. TAKE WITH THE 75MG  CAPSULE., Disp: 90 capsule, Rfl: 0   venlafaxine XR (EFFEXOR-XR) 75 MG 24  hr capsule, TAKE 1 CAPSULE BY MOUTH DAILY  WITH BREAKFAST FOR ANXIETY, Disp: 90 capsule, Rfl: 0  Observations/Objective: Patient is well-developed, well-nourished in no acute distress.  Resting comfortably  at home.  Head is normocephalic, atraumatic.  No labored breathing.  Speech is clear and coherent with logical content.  Patient is alert and oriented at baseline.   Assessment and Plan: 1. Podagra - colchicine 0.6 MG tablet; Take 2 capsules by mouth at gout flare, followed by 1 capsule 2 hours later. Then take 1 capsule twice daily until flare resolves.  Dispense: 30 tablet; Refill: 0  Increase fluids. Dietary recommendations reviewed. Colchicine per orders. Follow-up in person for any non-resolving, new or worsening symptoms.  Follow Up Instructions: I discussed the assessment and treatment plan with the patient. The patient was provided an opportunity to ask questions and all were answered. The patient agreed with the plan and demonstrated an understanding of the instructions.  A copy of instructions were sent to the patient via MyChart unless otherwise noted below.    The patient was advised to call back or seek an in-person evaluation if the symptoms worsen or if the condition fails to improve as anticipated.    Piedad Climes, PA-C

## 2024-02-03 ENCOUNTER — Encounter (INDEPENDENT_AMBULATORY_CARE_PROVIDER_SITE_OTHER): Payer: Self-pay | Admitting: Gastroenterology

## 2024-04-21 ENCOUNTER — Encounter: Payer: Self-pay | Admitting: Gastroenterology

## 2024-05-07 ENCOUNTER — Telehealth: Admitting: Physician Assistant

## 2024-05-07 DIAGNOSIS — H1032 Unspecified acute conjunctivitis, left eye: Secondary | ICD-10-CM | POA: Diagnosis not present

## 2024-05-07 MED ORDER — ERYTHROMYCIN 5 MG/GM OP OINT: 1.0000 | TOPICAL_OINTMENT | Freq: Four times a day (QID) | OPHTHALMIC | 0 refills | Status: AC

## 2024-05-07 NOTE — Patient Instructions (Signed)
" °  Nathaniel Frank, thank you for joining [name], PA-C for today's virtual visit.  While this provider is not your primary care provider (PCP), if your PCP is located in our provider database this encounter information will be shared with them immediately following your visit.   A [facility] MyChart account gives you access to today's visit and all your visits, tests, and labs performed at [facility]  click here if you don't have a [facility] MyChart account or go to [email or web address]  Consent: (Patient) Nathaniel Frank provided verbal consent for this virtual visit at the beginning of the encounter.  Current Medications:  Current Outpatient Medications:    colchicine  0.6 MG tablet, Take 2 capsules by mouth at gout flare, followed by 1 capsule 2 hours later. Then take 1 capsule twice daily until flare resolves., Disp: 30 tablet, Rfl: 0   venlafaxine  XR (EFFEXOR -XR) 37.5 MG 24 hr capsule, TAKE 1 CAPSULE BY MOUTH DAILY  WITH BREAKFAST FOR ANXIETY. TAKE WITH THE 75MG  CAPSULE., Disp: 90 capsule, Rfl: 0   venlafaxine  XR (EFFEXOR -XR) 75 MG 24 hr capsule, TAKE 1 CAPSULE BY MOUTH DAILY  WITH BREAKFAST FOR ANXIETY, Disp: 90 capsule, Rfl: 0   Medications ordered in this encounter:  No orders of the defined types were placed in this encounter.    *If you need refills on other medications prior to your next appointment, please contact your pharmacy*  Follow-Up: Call back or seek an in-person evaluation if the symptoms worsen or if the condition fails to improve as anticipated.  [facility] [phone]  Other Instructions Follow up with primary provider in 24-48 hours. Report to nearest ER with any worsening symptoms.    If you have been instructed to have an in-person evaluation today at a local Urgent Care facility, please use the link below. It will take you to a list of all of our available [facility], including address, phone number and  hours of operation. Please do not delay care.  [facility]  If you or a family member do not have a primary care provider, use the link below to schedule a visit and establish care. When you choose a [facility] primary care physician or advanced practice provider, you gain a long-term partner in health. Find a Primary Care Provider  Learn more about [facility]'s in-office and virtual care options:  - Get Care Now  "

## 2024-05-07 NOTE — Progress Notes (Signed)
 " Virtual Visit Consent   Nathaniel Frank, you are scheduled for a virtual visit with a [facility] provider today. Just as with appointments in the office, your consent must be obtained to participate. Your consent will be active for this visit and any virtual visit you may have with one of our providers in the next 365 days. If you have a MyChart account, a copy of this consent can be sent to you electronically.  As this is a virtual visit, video technology does not allow for your provider to perform a traditional examination. This may limit your provider's ability to fully assess your condition. If your provider identifies any concerns that need to be evaluated in person or the need to arrange testing (such as labs, EKG, etc.), we will make arrangements to do so. Although advances in technology are sophisticated, we cannot ensure that it will always work on either your end or our end. If the connection with a video visit is poor, the visit may have to be switched to a telephone visit. With either a video or telephone visit, we are not always able to ensure that we have a secure connection.  By engaging in this virtual visit, you consent to the provision of healthcare and authorize for your insurance to be billed (if applicable) for the services provided during this visit. Depending on your insurance coverage, you may receive a charge related to this service.  I need to obtain your verbal consent now. Are you willing to proceed with your visit today? Nathaniel Frank has provided verbal consent on 05/07/2024 for a virtual visit (video or telephone). Nathaniel Frank, [place]  Date: 05/07/2024 11:17 AM   Virtual Visit via Video Note   I, Nathaniel Frank, connected with  Nathaniel Frank  ([id number], [birth year]) on 05/07/24 at 11:00 AM EST by a video-enabled telemedicine application and verified that I am speaking with the correct person using two identifiers.  Location: Patient: Virtual Visit Location Patient:  Home Provider: Virtual Visit Location Provider: Home Office   I discussed the limitations of evaluation and management by telemedicine and the availability of in person appointments. The patient expressed understanding and agreed to proceed.    History of Present Illness: Nathaniel Frank is a 39 y.o. who identifies as a male who was assigned male at birth, and is being seen today for pinkeye.  HPI: Conjunctivitis  The current episode started 5 to 7 days ago. The onset was gradual. The problem has been gradually worsening. The problem is mild. Nothing aggravates the symptoms. Associated symptoms include eye itching and eye redness.    Problems:  Patient Active Problem List   Diagnosis Date Noted   Nausea with vomiting 06/09/2022   Right upper quadrant abdominal pain 05/27/2022   Elevated liver enzymes 05/27/2022   Persistent cough for 3 weeks or longer 05/15/2022   Exposure to Mycobacterium tuberculosis 04/10/2022   Gastroesophageal reflux disease 03/10/2022   Hyperlipidemia 11/08/2021   Obstruction of left tear duct 03/07/2021   Skin lesion 03/07/2021   Preoperative clearance 10/16/2020   Prediabetes 02/10/2020   Chronic gout 01/09/2017   GAD (generalized anxiety disorder) 10/14/2016   Preventative health care 10/14/2016   Allergic rhinitis 01/04/2016   Essential hypertension 01/04/2016    Allergies: Allergies[1] Medications: Current Medications[2]  Observations/Objective: Patient is well-developed, well-nourished in no acute distress.  Resting comfortably  at home.  Head is normocephalic, atraumatic.  No labored breathing.  Speech is clear and coherent with logical content.  Patient is alert  and oriented at baseline.  Injected left conjunctiva EOMI  Assessment and Plan: 1. Acute conjunctivitis of left eye, unspecified acute conjunctivitis type (Primary)  Patient presenting with 3 days of unilateral eye itching and increased tearing.  Presentation consistent with bacterial  conjunctivitis.   Patient's vision is grossly intact.  EOMI.  In addition to allergic  conjunctivitis, I also considered viral conjunctivitis, corneal abrasion, retained eye foreign body, corneal ulcer, glaucoma, open globe but this appears less likely considering history, physical exam, and data gathered.  Patient provided with prescription for eye drops.     I have instructed the patient to report to the ER at any time if there are any new or worsening symptoms. The patient expressed understanding of and agreement with this plan.  Follow Up Instructions: I discussed the assessment and treatment plan with the patient. The patient was provided an opportunity to ask questions and all were answered. The patient agreed with the plan and demonstrated an understanding of the instructions.  A copy of instructions were sent to the patient via MyChart unless otherwise noted below.     The patient was advised to call back or seek an in-person evaluation if the symptoms worsen or if the condition fails to improve as anticipated.    Nathaniel Robertshaw, PA-C     [1] No Known Allergies [2]  Current Outpatient Medications:    colchicine  0.6 MG tablet, Take 2 capsules by mouth at gout flare, followed by 1 capsule 2 hours later. Then take 1 capsule twice daily until flare resolves., Disp: 30 tablet, Rfl: 0   venlafaxine  XR (EFFEXOR -XR) 37.5 MG 24 hr capsule, TAKE 1 CAPSULE BY MOUTH DAILY  WITH BREAKFAST FOR ANXIETY. TAKE WITH THE 75MG  CAPSULE., Disp: 90 capsule, Rfl: 0   venlafaxine  XR (EFFEXOR -XR) 75 MG 24 hr capsule, TAKE 1 CAPSULE BY MOUTH DAILY  WITH BREAKFAST FOR ANXIETY, Disp: 90 capsule, Rfl: 0  "
